# Patient Record
Sex: Female | Born: 1965 | Race: Black or African American | Hispanic: No | Marital: Married | Smoking: Never smoker
Health system: Southern US, Community
[De-identification: ages and names within clinical notes are randomized; demographics above are authoritative.]

## PROBLEM LIST (undated history)

## (undated) DIAGNOSIS — D649 Anemia, unspecified: Secondary | ICD-10-CM

## (undated) DIAGNOSIS — N189 Chronic kidney disease, unspecified: Secondary | ICD-10-CM

## (undated) DIAGNOSIS — E785 Hyperlipidemia, unspecified: Secondary | ICD-10-CM

## (undated) DIAGNOSIS — A86 Unspecified viral encephalitis: Secondary | ICD-10-CM

## (undated) DIAGNOSIS — L309 Dermatitis, unspecified: Secondary | ICD-10-CM

## (undated) DIAGNOSIS — I10 Essential (primary) hypertension: Secondary | ICD-10-CM

## (undated) DIAGNOSIS — K219 Gastro-esophageal reflux disease without esophagitis: Secondary | ICD-10-CM

## (undated) DIAGNOSIS — F419 Anxiety disorder, unspecified: Secondary | ICD-10-CM

## (undated) DIAGNOSIS — R011 Cardiac murmur, unspecified: Secondary | ICD-10-CM

## (undated) DIAGNOSIS — Z01419 Encounter for gynecological examination (general) (routine) without abnormal findings: Secondary | ICD-10-CM

## (undated) DIAGNOSIS — R809 Proteinuria, unspecified: Secondary | ICD-10-CM

## (undated) HISTORY — DX: Encounter for gynecological examination (general) (routine) without abnormal findings: Z01.419

## (undated) HISTORY — DX: Cardiac murmur, unspecified: R01.1

## (undated) HISTORY — DX: Gastro-esophageal reflux disease without esophagitis: K21.9

## (undated) HISTORY — DX: Dermatitis, unspecified: L30.9

## (undated) HISTORY — DX: Anxiety disorder, unspecified: F41.9

## (undated) HISTORY — DX: Anemia, unspecified: D64.9

## (undated) HISTORY — DX: Essential (primary) hypertension: I10

## (undated) HISTORY — DX: Hyperlipidemia, unspecified: E78.5

## (undated) HISTORY — DX: Unspecified viral encephalitis: A86

## (undated) HISTORY — DX: Chronic kidney disease, unspecified: N18.9

## (undated) HISTORY — DX: Proteinuria, unspecified: R80.9

---

## 1986-01-22 DIAGNOSIS — A86 Unspecified viral encephalitis: Secondary | ICD-10-CM

## 1986-01-22 HISTORY — DX: Unspecified viral encephalitis: A86

## 1995-01-23 HISTORY — PX: TUBAL LIGATION: SHX77

## 1997-11-17 ENCOUNTER — Other Ambulatory Visit: Admission: RE | Admit: 1997-11-17 | Discharge: 1997-11-17 | Payer: Self-pay | Admitting: Otolaryngology

## 1998-03-10 ENCOUNTER — Other Ambulatory Visit: Admission: RE | Admit: 1998-03-10 | Discharge: 1998-03-10 | Payer: Self-pay | Admitting: Obstetrics and Gynecology

## 1999-04-20 ENCOUNTER — Other Ambulatory Visit: Admission: RE | Admit: 1999-04-20 | Discharge: 1999-04-20 | Payer: Self-pay | Admitting: Obstetrics and Gynecology

## 2000-04-29 ENCOUNTER — Other Ambulatory Visit: Admission: RE | Admit: 2000-04-29 | Discharge: 2000-04-29 | Payer: Self-pay | Admitting: Obstetrics and Gynecology

## 2002-06-25 ENCOUNTER — Other Ambulatory Visit: Admission: RE | Admit: 2002-06-25 | Discharge: 2002-06-25 | Payer: Self-pay | Admitting: Obstetrics and Gynecology

## 2003-08-04 ENCOUNTER — Other Ambulatory Visit: Admission: RE | Admit: 2003-08-04 | Discharge: 2003-08-04 | Payer: Self-pay | Admitting: Obstetrics and Gynecology

## 2004-02-16 ENCOUNTER — Ambulatory Visit: Payer: Self-pay | Admitting: Family Medicine

## 2004-02-23 ENCOUNTER — Ambulatory Visit: Payer: Self-pay | Admitting: Family Medicine

## 2004-08-15 ENCOUNTER — Other Ambulatory Visit: Admission: RE | Admit: 2004-08-15 | Discharge: 2004-08-15 | Payer: Self-pay | Admitting: Obstetrics and Gynecology

## 2005-03-02 ENCOUNTER — Ambulatory Visit: Payer: Self-pay | Admitting: Family Medicine

## 2005-03-09 ENCOUNTER — Ambulatory Visit: Payer: Self-pay | Admitting: Family Medicine

## 2005-04-12 ENCOUNTER — Ambulatory Visit: Payer: Self-pay | Admitting: Family Medicine

## 2006-03-25 ENCOUNTER — Ambulatory Visit: Payer: Self-pay | Admitting: Family Medicine

## 2006-03-25 LAB — CONVERTED CEMR LAB
BUN: 9 mg/dL (ref 6–23)
Basophils Relative: 0.5 % (ref 0.0–1.0)
Bilirubin, Direct: 0.1 mg/dL (ref 0.0–0.3)
CO2: 28 meq/L (ref 19–32)
Cholesterol: 190 mg/dL (ref 0–200)
Creatinine, Ser: 0.6 mg/dL (ref 0.4–1.2)
GFR calc Af Amer: 142 mL/min
Glucose, Bld: 82 mg/dL (ref 70–99)
HCT: 35.2 % — ABNORMAL LOW (ref 36.0–46.0)
HDL: 51.1 mg/dL (ref >39.0)
Hemoglobin: 11.9 g/dL — ABNORMAL LOW (ref 12.0–15.0)
LDL Cholesterol: 115 mg/dL — ABNORMAL HIGH (ref 0–99)
Lymphocytes Relative: 39.8 % (ref 12.0–46.0)
Monocytes Absolute: 0.4 10*3/uL (ref 0.2–0.7)
Monocytes Relative: 6.6 % (ref 3.0–11.0)
Neutro Abs: 3 10*3/uL (ref 1.4–7.7)
Neutrophils Relative %: 50.3 % (ref 43.0–77.0)
Potassium: 3.4 meq/L — ABNORMAL LOW (ref 3.5–5.1)
RDW: 13.5 % (ref 11.5–14.6)
Sodium: 141 meq/L (ref 135–145)
TSH: 0.54 microintl units/mL (ref 0.35–5.50)
Total Bilirubin: 0.6 mg/dL (ref 0.3–1.2)
Total Protein: 6.8 g/dL (ref 6.0–8.3)
VLDL: 24 mg/dL (ref 0–40)

## 2006-04-01 ENCOUNTER — Ambulatory Visit: Payer: Self-pay | Admitting: Family Medicine

## 2006-09-27 ENCOUNTER — Emergency Department (HOSPITAL_COMMUNITY): Admission: EM | Admit: 2006-09-27 | Discharge: 2006-09-27 | Payer: Self-pay | Admitting: Emergency Medicine

## 2006-10-04 DIAGNOSIS — I10 Essential (primary) hypertension: Secondary | ICD-10-CM

## 2007-04-18 ENCOUNTER — Ambulatory Visit: Payer: Self-pay | Admitting: Family Medicine

## 2007-04-18 LAB — CONVERTED CEMR LAB
Bilirubin Urine: NEGATIVE
Glucose, Urine, Semiquant: NEGATIVE
Protein, U semiquant: NEGATIVE
Specific Gravity, Urine: 1.015
pH: 7

## 2007-04-22 LAB — CONVERTED CEMR LAB
ALT: 14 units/L (ref 0–35)
AST: 17 units/L (ref 0–37)
Albumin: 4 g/dL (ref 3.5–5.2)
BUN: 9 mg/dL (ref 6–23)
Basophils Relative: 0.7 % (ref 0.0–1.0)
CO2: 29 meq/L (ref 19–32)
Calcium: 9.3 mg/dL (ref 8.4–10.5)
Chloride: 107 meq/L (ref 96–112)
Cholesterol: 185 mg/dL (ref 0–200)
Creatinine, Ser: 0.7 mg/dL (ref 0.4–1.2)
Eosinophils Absolute: 0.2 10*3/uL (ref 0.0–0.7)
Eosinophils Relative: 2.3 % (ref 0.0–5.0)
Hemoglobin: 11.7 g/dL — ABNORMAL LOW (ref 12.0–15.0)
MCV: 81 fL (ref 78.0–100.0)
Neutro Abs: 4.3 10*3/uL (ref 1.4–7.7)
Neutrophils Relative %: 54.1 % (ref 43.0–77.0)
RBC: 4.44 M/uL (ref 3.87–5.11)
TSH: 0.56 microintl units/mL (ref 0.35–5.50)
VLDL: 16 mg/dL (ref 0–40)
WBC: 8 10*3/uL (ref 4.5–10.5)

## 2007-04-30 ENCOUNTER — Ambulatory Visit: Payer: Self-pay | Admitting: Family Medicine

## 2007-04-30 DIAGNOSIS — E785 Hyperlipidemia, unspecified: Secondary | ICD-10-CM

## 2007-04-30 DIAGNOSIS — Z8679 Personal history of other diseases of the circulatory system: Secondary | ICD-10-CM | POA: Insufficient documentation

## 2007-04-30 DIAGNOSIS — D509 Iron deficiency anemia, unspecified: Secondary | ICD-10-CM

## 2007-11-10 ENCOUNTER — Ambulatory Visit: Payer: Self-pay | Admitting: Family Medicine

## 2007-11-10 DIAGNOSIS — J069 Acute upper respiratory infection, unspecified: Secondary | ICD-10-CM | POA: Insufficient documentation

## 2007-11-10 DIAGNOSIS — H6120 Impacted cerumen, unspecified ear: Secondary | ICD-10-CM

## 2008-06-23 ENCOUNTER — Ambulatory Visit: Payer: Self-pay | Admitting: Family Medicine

## 2008-06-25 LAB — CONVERTED CEMR LAB
AST: 21 units/L (ref 0–37)
Albumin: 4.1 g/dL (ref 3.5–5.2)
Basophils Absolute: 0 10*3/uL (ref 0.0–0.1)
CO2: 31 meq/L (ref 19–32)
Chloride: 106 meq/L (ref 96–112)
Cholesterol: 195 mg/dL (ref 0–200)
Eosinophils Absolute: 0 10*3/uL (ref 0.0–0.7)
Glucose, Bld: 77 mg/dL (ref 70–99)
HCT: 35.5 % — ABNORMAL LOW (ref 36.0–46.0)
Hemoglobin: 11.8 g/dL — ABNORMAL LOW (ref 12.0–15.0)
Lymphs Abs: 1.8 10*3/uL (ref 0.7–4.0)
MCHC: 33.3 g/dL (ref 30.0–36.0)
MCV: 80.7 fL (ref 78.0–100.0)
Monocytes Absolute: 0.3 10*3/uL (ref 0.1–1.0)
Monocytes Relative: 4.2 % (ref 3.0–12.0)
Neutro Abs: 4.2 10*3/uL (ref 1.4–7.7)
Potassium: 3.1 meq/L — ABNORMAL LOW (ref 3.5–5.1)
RDW: 13.3 % (ref 11.5–14.6)
Sodium: 140 meq/L (ref 135–145)
TSH: 0.59 microintl units/mL (ref 0.35–5.50)

## 2008-06-30 ENCOUNTER — Ambulatory Visit: Payer: Self-pay | Admitting: Family Medicine

## 2009-01-22 HISTORY — PX: TONSILLECTOMY: SUR1361

## 2009-06-27 ENCOUNTER — Ambulatory Visit: Payer: Self-pay | Admitting: Family Medicine

## 2009-07-07 LAB — CONVERTED CEMR LAB
Ketones, urine, test strip: NEGATIVE
Nitrite: NEGATIVE
Specific Gravity, Urine: 1.015

## 2009-07-08 LAB — CONVERTED CEMR LAB
Albumin: 4 g/dL (ref 3.5–5.2)
Basophils Relative: 0.7 % (ref 0.0–3.0)
CO2: 30 meq/L (ref 19–32)
Chloride: 106 meq/L (ref 96–112)
Creatinine, Ser: 0.6 mg/dL (ref 0.4–1.2)
Eosinophils Absolute: 0.1 10*3/uL (ref 0.0–0.7)
HCT: 33.6 % — ABNORMAL LOW (ref 36.0–46.0)
Hemoglobin: 11.4 g/dL — ABNORMAL LOW (ref 12.0–15.0)
MCHC: 33.8 g/dL (ref 30.0–36.0)
MCV: 82.4 fL (ref 78.0–100.0)
Monocytes Absolute: 0.3 10*3/uL (ref 0.1–1.0)
Neutro Abs: 3.8 10*3/uL (ref 1.4–7.7)
RBC: 4.08 M/uL (ref 3.87–5.11)
Sodium: 144 meq/L (ref 135–145)
Total CHOL/HDL Ratio: 3
Total Protein: 6.9 g/dL (ref 6.0–8.3)
Triglycerides: 46 mg/dL (ref 0.0–149.0)

## 2009-07-11 ENCOUNTER — Ambulatory Visit: Payer: Self-pay | Admitting: Family Medicine

## 2010-02-23 NOTE — Assessment & Plan Note (Signed)
Summary: CPX // RS---PT [facility] DUE TO BMP // RS   Vital Signs:  Patient profile:   45 year old female Height:      62 inches Weight:      187 pounds BMI:     34.33 BP sitting:   106 / 82  (left arm) Cuff size:   regular  Vitals Entered By: Raechel Ache, RN (July 11, 2009 8:38 AM) CC: CPX, labs done. Sees gyn.   History of Present Illness: 45 yr old female for a cpx. She feels well and has no concerns.   Allergies: 1)  ! Prinivil (Lisinopril)  Past History:  Past Medical History: Reviewed history from 04/30/2007 and no changes required. Hypertension Heart murmur Coma for 3 days for viral encephalitis Anemia-iron deficiency Hyperlipidemia sees Dr. Arelia Sneddon for gyn exams eczema  Past Surgical History: Reviewed history from 04/30/2007 and no changes required. Tubal ligation Tonsillectomy  Family History: Reviewed history from 04/30/2007 and no changes required. Unremarkable  Social History: Reviewed history from 04/30/2007 and no changes required. Married Never Smoked Alcohol use-yes Drug use-no  Review of Systems  The patient denies anorexia, fever, weight loss, weight gain, vision loss, decreased hearing, hoarseness, chest pain, syncope, dyspnea on exertion, peripheral edema, prolonged cough, headaches, hemoptysis, abdominal pain, melena, hematochezia, severe indigestion/heartburn, hematuria, incontinence, genital sores, muscle weakness, suspicious skin lesions, transient blindness, difficulty walking, depression, unusual weight change, abnormal bleeding, enlarged lymph nodes, angioedema, breast masses, and testicular masses.    Physical Exam  General:  overweight-appearing.   Head:  Normocephalic and atraumatic without obvious abnormalities. No apparent alopecia or balding. Eyes:  No corneal or conjunctival inflammation noted. EOMI. Perrla. Funduscopic exam benign, without hemorrhages, exudates or papilledema. Vision grossly normal. Ears:  External ear exam  shows no significant lesions or deformities.  Otoscopic examination reveals clear canals, tympanic membranes are intact bilaterally without bulging, retraction, inflammation or discharge. Hearing is grossly normal bilaterally. Nose:  External nasal examination shows no deformity or inflammation. Nasal mucosa are pink and moist without lesions or exudates. Mouth:  Oral mucosa and oropharynx without lesions or exudates.  Teeth in good repair. Neck:  No deformities, masses, or tenderness noted. Chest Wall:  No deformities, masses, or tenderness noted. Lungs:  Normal respiratory effort, chest expands symmetrically. Lungs are clear to auscultation, no crackles or wheezes. Heart:  Normal rate and regular rhythm. S1 and S2 normal without gallop, murmur, click, rub or other extra sounds. Abdomen:  Bowel sounds positive,abdomen soft and non-tender without masses, organomegaly or hernias noted. Msk:  No deformity or scoliosis noted of thoracic or lumbar spine.   Pulses:  R and L carotid,radial,femoral,dorsalis pedis and posterior tibial pulses are full and equal bilaterally Extremities:  No clubbing, cyanosis, edema, or deformity noted with normal full range of motion of all joints.   Neurologic:  No cranial nerve deficits noted. Station and gait are normal. Plantar reflexes are down-going bilaterally. DTRs are symmetrical throughout. Sensory, motor and coordinative functions appear intact. Skin:  Intact without suspicious lesions or rashes Cervical Nodes:  No lymphadenopathy noted Axillary Nodes:  No palpable lymphadenopathy Inguinal Nodes:  No significant adenopathy Psych:  Cognition and judgment appear intact. Alert and cooperative with normal attention span and concentration. No apparent delusions, illusions, hallucinations   Impression & Recommendations:  Problem # 1:  PHYSICAL EXAMINATION (ICD-V70.0)  Complete Medication List: 1)  Hydrochlorothiazide 25 Mg Tabs (Hydrochlorothiazide) .... Once  daily 2)  Triamcinolone Acetonide 0.5 % Crea (Triamcinolone acetonide) .... Apply two times  a day as needed 3)  Iron 325 (65 Fe) Mg Tabs (Ferrous sulfate) .Marland Kitchen.. 1 by mouth once daily 4)  Klor-con M20 20 Meq Cr-tabs (Potassium chloride crys cr) .... Once daily 5)  Norvasc 10 Mg Tabs (Amlodipine besylate) .... Once daily  Patient Instructions: 1)  Please schedule a follow-up appointment in 6 months .  2)  It is important that you exercise reguarly at least 20 minutes 5 times a week. If you develop chest pain, have severe difficulty breathing, or feel very tired, stop exercising immediately and seek medical attention.  3)  You need to lose weight. Consider a lower calorie diet and regular exercise.  Prescriptions: NORVASC 10 MG TABS (AMLODIPINE BESYLATE) once daily  #90 x 3   Entered and Authorized by:   Nelwyn Salisbury MD   Signed by:   Nelwyn Salisbury MD on 07/11/2009   Method used:   Electronically to        [facility]  [address] [phone]* (retail)       [address]       [place], [place]  [address]       Ph: [phone]       Fax: [phone]   RxID:   [id number] KLOR-CON M20 20 MEQ CR-TABS (POTASSIUM CHLORIDE CRYS CR) once daily  #90 x 3   Entered and Authorized by:   Nelwyn Salisbury MD   Signed by:   Nelwyn Salisbury MD on 07/11/2009   Method used:   Electronically to        [facility]  [address] [phone]* (retail)       [address]       [place], [place]  [address]       Ph: [phone]       Fax: [phone]   RxID:   [id number] HYDROCHLOROTHIAZIDE 25 MG  TABS (HYDROCHLOROTHIAZIDE) once daily  #90 x 3   Entered and Authorized by:   Nelwyn Salisbury MD   Signed by:   Nelwyn Salisbury MD on 07/11/2009   Method used:   [facility]ically to        [facility]  [address] [phone]* (retail)       [address]       [place], [place]  [address]       Ph: [phone]       Fax: [phone]   RxID:   [id number]

## 2010-06-09 NOTE — Assessment & Plan Note (Signed)
[facility] OFFICE NOTE   Kristina Gillespie, Kristina Gillespie                     MRN:          [id number]  DATE:04/01/2006                            DOB:          [birth year]    This is a 45 year old woman here for a non-gynecological physical  examination.  She feels fine and has no complaints at all.  She  continues to see Dr. Arelia Sneddon on a regular basis for GYN exams.  We had  treated her in the past for acid reflux symptoms but these are well-  controlled.  She tries to watch her diet but admits to getting very  little exercise.  For further details of her past medical history,  family history, social history, labs, etc., refer to her last physical  note dated March 09, 2005.   ALLERGIES:  PRINIVIL.   CURRENT MEDICATIONS:  1. Hydrochlorothiazide 12.5 mg per day.  2. Atenolol 25 mg per day.  3. K-Dur 10 mEq per day.   OBJECTIVE:  VITAL SIGNS:  Height 5 feet 3 inches, weight 200, BP 132/82,  pulse 68 and regular.  GENERAL:  She remains overweight.  SKIN:  Clear.  HEENT:  Eyes clear.  Sclerae and pharynx are clear.  NECK:  Supple without lymphadenopathy or masses.  LUNGS:  Clear.  CARDIAC:  Rate and rhythm regular without gallops, murmurs or rubs.  The  distal pulses are full.  ABDOMEN:  Soft, normal bowel sounds, nontender, no masses.  EXTREMITIES:  No clubbing, cyanosis, or edema.  NEUROLOGIC:  Grossly intact.   She was here for fasting labs on March 3.  This is remarkable for an  excellent HDL at 51 with a slightly elevated LDL at 115.  Hemoglobin was  acceptable at 11.9.   ASSESSMENT AND PLAN:  1. Complete physical.  We talked about increasing exercise and losing      weight.  2. Hypertension, stable.  3. Anemia, stable off of medications.  4. Gastroesophageal reflux disease, stable off of medications.     Tera Mater. Clent Ridges, MD  Electronically Signed   SAF/MedQ  DD: 04/01/2006  DT: 04/02/2006  Job #:  [id number]

## 2010-08-09 ENCOUNTER — Other Ambulatory Visit: Payer: Self-pay | Admitting: Family Medicine

## 2010-08-21 ENCOUNTER — Other Ambulatory Visit: Payer: Self-pay | Admitting: *Deleted

## 2010-08-21 ENCOUNTER — Other Ambulatory Visit (INDEPENDENT_AMBULATORY_CARE_PROVIDER_SITE_OTHER): Payer: Self-pay | Admitting: Family Medicine

## 2010-08-21 ENCOUNTER — Other Ambulatory Visit: Payer: Self-pay | Admitting: Family Medicine

## 2010-08-21 ENCOUNTER — Other Ambulatory Visit (INDEPENDENT_AMBULATORY_CARE_PROVIDER_SITE_OTHER): Payer: Self-pay

## 2010-08-21 DIAGNOSIS — Z0000 Encounter for general adult medical examination without abnormal findings: Secondary | ICD-10-CM

## 2010-08-21 LAB — CBC WITH DIFFERENTIAL/PLATELET
Basophils Absolute: 0 10*3/uL (ref 0.0–0.1)
Basophils Relative: 0.6 % (ref 0.0–3.0)
Eosinophils Relative: 3 % (ref 0.0–5.0)
HCT: 34.3 % — ABNORMAL LOW (ref 36.0–46.0)
Hemoglobin: 11.1 g/dL — ABNORMAL LOW (ref 12.0–15.0)
Lymphocytes Relative: 42.6 % (ref 12.0–46.0)
Lymphs Abs: 3.2 10*3/uL (ref 0.7–4.0)
Monocytes Relative: 4.6 % (ref 3.0–12.0)
Neutro Abs: 3.7 10*3/uL (ref 1.4–7.7)
RBC: 4.24 Mil/uL (ref 3.87–5.11)
WBC: 7.4 10*3/uL (ref 4.5–10.5)

## 2010-08-21 LAB — HEPATIC FUNCTION PANEL
ALT: 18 U/L (ref 0–35)
AST: 19 U/L (ref 0–37)
Albumin: 4.1 g/dL (ref 3.5–5.2)
Alkaline Phosphatase: 50 U/L (ref 39–117)
Bilirubin, Direct: 0 mg/dL (ref 0.0–0.3)
Total Bilirubin: 0.5 mg/dL (ref 0.3–1.2)
Total Protein: 7.2 g/dL (ref 6.0–8.3)

## 2010-08-21 LAB — BASIC METABOLIC PANEL
Calcium: 8.9 mg/dL (ref 8.4–10.5)
GFR: 167.05 mL/min (ref >60.00)
Potassium: 3.2 mEq/L — ABNORMAL LOW (ref 3.5–5.1)
Sodium: 143 mEq/L (ref 135–145)

## 2010-08-21 LAB — LIPID PANEL
HDL: 61.9 mg/dL (ref >39.00)
LDL Cholesterol: 99 mg/dL (ref 0–99)
Total CHOL/HDL Ratio: 3
VLDL: 16.2 mg/dL (ref 0.0–40.0)

## 2010-08-21 LAB — TSH: TSH: 0.51 u[IU]/mL (ref 0.35–5.50)

## 2010-08-21 LAB — URINALYSIS
Bilirubin Urine: NEGATIVE
Hgb urine dipstick: NEGATIVE
Urine Glucose: NEGATIVE
Urobilinogen, UA: 0.2 (ref 0.0–1.0)

## 2010-08-25 ENCOUNTER — Encounter: Payer: Self-pay | Admitting: Family Medicine

## 2010-08-28 ENCOUNTER — Ambulatory Visit (INDEPENDENT_AMBULATORY_CARE_PROVIDER_SITE_OTHER): Payer: Commercial Managed Care - PPO | Admitting: Family Medicine

## 2010-08-28 ENCOUNTER — Encounter: Payer: Self-pay | Admitting: Family Medicine

## 2010-08-28 VITALS — BP 116/70 | HR 68 | Temp 98.5°F | Ht 62.0 in | Wt 186.0 lb

## 2010-08-28 DIAGNOSIS — Z0000 Encounter for general adult medical examination without abnormal findings: Secondary | ICD-10-CM

## 2010-08-28 MED ORDER — AMLODIPINE BESYLATE 10 MG PO TABS: 10.0000 mg | ORAL_TABLET | Freq: Every day | ORAL | Status: DC

## 2010-08-28 MED ORDER — HYDROCHLOROTHIAZIDE 25 MG PO TABS: 25.0000 mg | ORAL_TABLET | Freq: Every day | ORAL | Status: DC

## 2010-08-28 MED ORDER — POTASSIUM CHLORIDE CRYS ER 20 MEQ PO TBCR: 20.0000 meq | EXTENDED_RELEASE_TABLET | Freq: Two times a day (BID) | ORAL | Status: DC

## 2010-08-28 NOTE — Progress Notes (Signed)
  Subjective:    Patient ID: Kristina Gillespie, female    DOB: 21-Nov-1965, 45 y.o.   MRN: [id number]  HPI 45 yr old female for a cpx. She feels fine and has no complaints.    Review of Systems  Constitutional: Negative.   HENT: Negative.   Eyes: Negative.   Respiratory: Negative.   Cardiovascular: Negative.   Gastrointestinal: Negative.   Genitourinary: Negative for dysuria, urgency, frequency, hematuria, flank pain, decreased urine volume, enuresis, difficulty urinating, pelvic pain and dyspareunia.  Musculoskeletal: Negative.   Skin: Negative.   Neurological: Negative.   Hematological: Negative.   Psychiatric/Behavioral: Negative.        Objective:   Physical Exam  Constitutional: She is oriented to person, place, and time. She appears well-developed and well-nourished. No distress.  HENT:  Head: Normocephalic and atraumatic.  Right Ear: External ear normal.  Left Ear: External ear normal.  Nose: Nose normal.  Mouth/Throat: Oropharynx is clear and moist. No oropharyngeal exudate.  Eyes: Conjunctivae and EOM are normal. Pupils are equal, round, and reactive to light. No scleral icterus.  Neck: Normal range of motion. Neck supple. No JVD present. No thyromegaly present.  Cardiovascular: Normal rate, regular rhythm, normal heart sounds and intact distal pulses.  Exam reveals no gallop and no friction rub.   No murmur heard. Pulmonary/Chest: Effort normal and breath sounds normal. No respiratory distress. She has no wheezes. She has no rales. She exhibits no tenderness.  Abdominal: Soft. Bowel sounds are normal. She exhibits no distension and no mass. There is no tenderness. There is no rebound and no guarding.  Musculoskeletal: Normal range of motion. She exhibits no edema and no tenderness.  Lymphadenopathy:    She has no cervical adenopathy.  Neurological: She is alert and oriented to person, place, and time. She has normal reflexes. No cranial nerve deficit. She exhibits  normal muscle tone. Coordination normal.  Skin: Skin is warm and dry. No rash noted. No erythema.  Psychiatric: She has a normal mood and affect. Her behavior is normal. Judgment and thought content normal.          Assessment & Plan:  Well exam. Get more exercise

## 2010-10-16 ENCOUNTER — Ambulatory Visit: Payer: Self-pay | Admitting: Internal Medicine

## 2010-11-03 LAB — POCT URINALYSIS DIP (DEVICE)
Operator id: 239701
Protein, ur: NEGATIVE
Specific Gravity, Urine: 1.01
Urobilinogen, UA: 0.2
pH: 6

## 2011-08-22 ENCOUNTER — Other Ambulatory Visit (INDEPENDENT_AMBULATORY_CARE_PROVIDER_SITE_OTHER): Payer: Commercial Managed Care - PPO

## 2011-08-22 DIAGNOSIS — Z0000 Encounter for general adult medical examination without abnormal findings: Secondary | ICD-10-CM

## 2011-08-22 LAB — BASIC METABOLIC PANEL
BUN: 12 mg/dL (ref 6–23)
Calcium: 9.1 mg/dL (ref 8.4–10.5)
Creatinine, Ser: 0.7 mg/dL (ref 0.4–1.2)
GFR: 113.54 mL/min (ref >60.00)
Glucose, Bld: 76 mg/dL (ref 70–99)
Sodium: 138 mEq/L (ref 135–145)

## 2011-08-22 LAB — CBC WITH DIFFERENTIAL/PLATELET
Basophils Absolute: 0 10*3/uL (ref 0.0–0.1)
Eosinophils Absolute: 0.2 10*3/uL (ref 0.0–0.7)
HCT: 39.1 % (ref 36.0–46.0)
Hemoglobin: 12.9 g/dL (ref 12.0–15.0)
Lymphs Abs: 2.5 10*3/uL (ref 0.7–4.0)
MCHC: 33 g/dL (ref 30.0–36.0)
Monocytes Relative: 4.8 % (ref 3.0–12.0)
Neutro Abs: 4 10*3/uL (ref 1.4–7.7)
Platelets: 182 10*3/uL (ref 150.0–400.0)
RDW: 15.9 % — ABNORMAL HIGH (ref 11.5–14.6)

## 2011-08-22 LAB — LIPID PANEL
Cholesterol: 179 mg/dL (ref 0–200)
HDL: 60.7 mg/dL (ref >39.00)
Triglycerides: 109 mg/dL (ref 0.0–149.0)
VLDL: 21.8 mg/dL (ref 0.0–40.0)

## 2011-08-22 LAB — POCT URINALYSIS DIPSTICK
Bilirubin, UA: NEGATIVE
Blood, UA: NEGATIVE
Ketones, UA: NEGATIVE
Protein, UA: NEGATIVE
Spec Grav, UA: 1.015
pH, UA: 7

## 2011-08-22 LAB — TSH: TSH: 0.57 u[IU]/mL (ref 0.35–5.50)

## 2011-08-22 LAB — HEPATIC FUNCTION PANEL
AST: 17 U/L (ref 0–37)
Albumin: 4.1 g/dL (ref 3.5–5.2)
Total Bilirubin: 0.4 mg/dL (ref 0.3–1.2)

## 2011-08-27 NOTE — Progress Notes (Signed)
Quick Note:  I spoke with pt and she will wait until this week to get the new increase on potassium. ______

## 2011-08-29 ENCOUNTER — Ambulatory Visit (INDEPENDENT_AMBULATORY_CARE_PROVIDER_SITE_OTHER): Payer: Commercial Managed Care - PPO | Admitting: Family Medicine

## 2011-08-29 ENCOUNTER — Encounter: Payer: Self-pay | Admitting: Family Medicine

## 2011-08-29 VITALS — BP 120/68 | HR 96 | Temp 98.4°F | Ht 62.75 in | Wt 184.0 lb

## 2011-08-29 DIAGNOSIS — Z0000 Encounter for general adult medical examination without abnormal findings: Secondary | ICD-10-CM

## 2011-08-29 MED ORDER — AMLODIPINE BESYLATE 10 MG PO TABS: 10.0000 mg | ORAL_TABLET | Freq: Every day | ORAL | Status: DC

## 2011-08-29 MED ORDER — TRIAMCINOLONE ACETONIDE 0.5 % EX CREA: TOPICAL_CREAM | Freq: Two times a day (BID) | CUTANEOUS | Status: DC

## 2011-08-29 MED ORDER — FERROUS GLUCONATE 325 MG PO TABS: 325.0000 mg | ORAL_TABLET | Freq: Every day | ORAL | Status: DC

## 2011-08-29 MED ORDER — HYDROCHLOROTHIAZIDE 25 MG PO TABS: 25.0000 mg | ORAL_TABLET | Freq: Every day | ORAL | Status: DC

## 2011-08-29 MED ORDER — POTASSIUM CHLORIDE CRYS ER 20 MEQ PO TBCR: 20.0000 meq | EXTENDED_RELEASE_TABLET | Freq: Two times a day (BID) | ORAL | Status: DC

## 2011-08-29 NOTE — Progress Notes (Signed)
  Subjective:    Patient ID: Kristina Gillespie, female    DOB: 29-Feb-1965, 46 y.o.   MRN: [id number]  HPI 46 yr old female for a cpx. She feels fine and has no concerns. Her recent potassium level was low, and we thought she was taking her potassium pills twice a day. In fact she has only been taking these once a day.    Review of Systems  Constitutional: Negative.   HENT: Negative.   Eyes: Negative.   Respiratory: Negative.   Cardiovascular: Negative.   Gastrointestinal: Negative.   Genitourinary: Negative for dysuria, urgency, frequency, hematuria, flank pain, decreased urine volume, enuresis, difficulty urinating, pelvic pain and dyspareunia.  Musculoskeletal: Negative.   Skin: Negative.   Neurological: Negative.   Hematological: Negative.   Psychiatric/Behavioral: Negative.        Objective:   Physical Exam  Constitutional: She is oriented to person, place, and time. She appears well-developed and well-nourished. No distress.  HENT:  Head: Normocephalic and atraumatic.  Right Ear: External ear normal.  Left Ear: External ear normal.  Nose: Nose normal.  Mouth/Throat: Oropharynx is clear and moist. No oropharyngeal exudate.  Eyes: Conjunctivae and EOM are normal. Pupils are equal, round, and reactive to light. No scleral icterus.  Neck: Normal range of motion. Neck supple. No JVD present. No thyromegaly present.  Cardiovascular: Normal rate, regular rhythm, normal heart sounds and intact distal pulses.  Exam reveals no gallop and no friction rub.   No murmur heard. Pulmonary/Chest: Effort normal and breath sounds normal. No respiratory distress. She has no wheezes. She has no rales. She exhibits no tenderness.  Abdominal: Soft. Bowel sounds are normal. She exhibits no distension and no mass. There is no tenderness. There is no rebound and no guarding.  Musculoskeletal: Normal range of motion. She exhibits no edema and no tenderness.  Lymphadenopathy:    She has no cervical  adenopathy.  Neurological: She is alert and oriented to person, place, and time. She has normal reflexes. No cranial nerve deficit. She exhibits normal muscle tone. Coordination normal.  Skin: Skin is warm and dry. No rash noted. No erythema.  Psychiatric: She has a normal mood and affect. Her behavior is normal. Judgment and thought content normal.          Assessment & Plan:  Well exam. Encouraged her to take the potassium bid as prescribed.

## 2012-08-29 ENCOUNTER — Other Ambulatory Visit (INDEPENDENT_AMBULATORY_CARE_PROVIDER_SITE_OTHER): Payer: 59

## 2012-08-29 ENCOUNTER — Other Ambulatory Visit: Payer: Self-pay | Admitting: Family Medicine

## 2012-08-29 ENCOUNTER — Encounter: Payer: Self-pay | Admitting: Family Medicine

## 2012-08-29 DIAGNOSIS — Z0000 Encounter for general adult medical examination without abnormal findings: Secondary | ICD-10-CM

## 2012-08-29 DIAGNOSIS — E876 Hypokalemia: Secondary | ICD-10-CM

## 2012-08-29 LAB — CBC WITH DIFFERENTIAL/PLATELET
Basophils Absolute: 0 10*3/uL (ref 0.0–0.1)
HCT: 39.7 % (ref 36.0–46.0)
Hemoglobin: 13.2 g/dL (ref 12.0–15.0)
Lymphs Abs: 2.9 10*3/uL (ref 0.7–4.0)
MCV: 89.1 fl (ref 78.0–100.0)
Monocytes Absolute: 0.3 10*3/uL (ref 0.1–1.0)
Monocytes Relative: 3.9 % (ref 3.0–12.0)
Neutro Abs: 5.3 10*3/uL (ref 1.4–7.7)
Platelets: 202 10*3/uL (ref 150.0–400.0)
RDW: 13.1 % (ref 11.5–14.6)

## 2012-08-29 LAB — BASIC METABOLIC PANEL
BUN: 9 mg/dL (ref 6–23)
CO2: 29 mEq/L (ref 19–32)
Creatinine, Ser: 0.7 mg/dL (ref 0.4–1.2)
Glucose, Bld: 81 mg/dL (ref 70–99)
Potassium: 2.8 mEq/L — CL (ref 3.5–5.1)
Sodium: 139 mEq/L (ref 135–145)

## 2012-08-29 LAB — POCT URINALYSIS DIPSTICK
Glucose, UA: NEGATIVE
Nitrite, UA: NEGATIVE
Urobilinogen, UA: 1

## 2012-08-29 LAB — TSH: TSH: 0.64 u[IU]/mL (ref 0.35–5.50)

## 2012-08-29 LAB — LIPID PANEL
LDL Cholesterol: 100 mg/dL — ABNORMAL HIGH (ref 0–99)
Total CHOL/HDL Ratio: 3

## 2012-08-29 LAB — HEPATIC FUNCTION PANEL
ALT: 14 U/L (ref 0–35)
Albumin: 3.9 g/dL (ref 3.5–5.2)
Total Bilirubin: 0.6 mg/dL (ref 0.3–1.2)

## 2012-08-29 NOTE — Progress Notes (Signed)
Quick Note:  I spoke with pt, she does not need refill right now. ______

## 2012-09-05 ENCOUNTER — Ambulatory Visit (INDEPENDENT_AMBULATORY_CARE_PROVIDER_SITE_OTHER): Payer: Commercial Managed Care - PPO | Admitting: Family Medicine

## 2012-09-05 DIAGNOSIS — I10 Essential (primary) hypertension: Secondary | ICD-10-CM

## 2012-09-07 NOTE — Progress Notes (Signed)
  Subjective:    Patient ID: Kristina Gillespie, female    DOB: [birth year], 47 y.o.   MRN: [id number]  HPI This appt was cancelled   Review of Systems     Objective:   Physical Exam        Assessment & Plan:

## 2012-09-08 ENCOUNTER — Encounter: Payer: Self-pay | Admitting: Family Medicine

## 2012-09-08 ENCOUNTER — Ambulatory Visit (INDEPENDENT_AMBULATORY_CARE_PROVIDER_SITE_OTHER): Payer: 59 | Admitting: Family Medicine

## 2012-09-08 VITALS — BP 120/88 | Temp 98.3°F | Ht 62.0 in | Wt 185.0 lb

## 2012-09-08 DIAGNOSIS — Z0000 Encounter for general adult medical examination without abnormal findings: Secondary | ICD-10-CM

## 2012-09-08 MED ORDER — TRIAMCINOLONE ACETONIDE 0.5 % EX CREA: TOPICAL_CREAM | Freq: Two times a day (BID) | CUTANEOUS | Status: DC

## 2012-09-08 MED ORDER — POTASSIUM CHLORIDE CRYS ER 20 MEQ PO TBCR: 40.0000 meq | EXTENDED_RELEASE_TABLET | Freq: Two times a day (BID) | ORAL | Status: DC

## 2012-09-08 MED ORDER — FERROUS GLUCONATE 325 MG PO TABS: 325.0000 mg | ORAL_TABLET | Freq: Every day | ORAL | Status: DC

## 2012-09-08 MED ORDER — AMLODIPINE BESYLATE 10 MG PO TABS: 10.0000 mg | ORAL_TABLET | Freq: Every day | ORAL | Status: DC

## 2012-09-08 MED ORDER — HYDROCHLOROTHIAZIDE 25 MG PO TABS: 25.0000 mg | ORAL_TABLET | Freq: Every day | ORAL | Status: DC

## 2012-09-08 NOTE — Progress Notes (Signed)
  Subjective:    Patient ID: Kristina Gillespie, female    DOB: April 17, 1965, 47 y.o.   MRN: [id number]  HPI 47 yr old female for a cpx. She feels well.    Review of Systems  Constitutional: Negative.   HENT: Negative.   Eyes: Negative.   Respiratory: Negative.   Cardiovascular: Negative.   Gastrointestinal: Negative.   Genitourinary: Negative for dysuria, urgency, frequency, hematuria, flank pain, decreased urine volume, enuresis, difficulty urinating, pelvic pain and dyspareunia.  Musculoskeletal: Negative.   Skin: Negative.   Neurological: Negative.   Psychiatric/Behavioral: Negative.        Objective:   Physical Exam  Constitutional: She is oriented to person, place, and time. She appears well-developed and well-nourished. No distress.  HENT:  Head: Normocephalic and atraumatic.  Right Ear: External ear normal.  Left Ear: External ear normal.  Nose: Nose normal.  Mouth/Throat: Oropharynx is clear and moist. No oropharyngeal exudate.  Eyes: Conjunctivae and EOM are normal. Pupils are equal, round, and reactive to light. No scleral icterus.  Neck: Normal range of motion. Neck supple. No JVD present. No thyromegaly present.  Cardiovascular: Normal rate, regular rhythm, normal heart sounds and intact distal pulses.  Exam reveals no gallop and no friction rub.   No murmur heard. Pulmonary/Chest: Effort normal and breath sounds normal. No respiratory distress. She has no wheezes. She has no rales. She exhibits no tenderness.  Abdominal: Soft. Bowel sounds are normal. She exhibits no distension and no mass. There is no tenderness. There is no rebound and no guarding.  Musculoskeletal: Normal range of motion. She exhibits no edema and no tenderness.  Lymphadenopathy:    She has no cervical adenopathy.  Neurological: She is alert and oriented to person, place, and time. She has normal reflexes. No cranial nerve deficit. She exhibits normal muscle tone. Coordination normal.  Skin: Skin  is warm and dry. No rash noted. No erythema.  Psychiatric: She has a normal mood and affect. Her behavior is normal. Judgment and thought content normal.          Assessment & Plan:  Well exam.

## 2012-09-10 ENCOUNTER — Other Ambulatory Visit: Payer: Self-pay | Admitting: Family Medicine

## 2012-09-12 ENCOUNTER — Other Ambulatory Visit (INDEPENDENT_AMBULATORY_CARE_PROVIDER_SITE_OTHER): Payer: 59

## 2012-09-12 DIAGNOSIS — I10 Essential (primary) hypertension: Secondary | ICD-10-CM

## 2012-09-12 LAB — BASIC METABOLIC PANEL
CO2: 27 mEq/L (ref 19–32)
Calcium: 9.2 mg/dL (ref 8.4–10.5)
GFR: 114.89 mL/min (ref >60.00)
Glucose, Bld: 98 mg/dL (ref 70–99)
Potassium: 3.2 mEq/L — ABNORMAL LOW (ref 3.5–5.1)
Sodium: 138 mEq/L (ref 135–145)

## 2012-11-27 ENCOUNTER — Other Ambulatory Visit: Payer: Self-pay

## 2013-08-31 ENCOUNTER — Other Ambulatory Visit (INDEPENDENT_AMBULATORY_CARE_PROVIDER_SITE_OTHER): Payer: 59

## 2013-08-31 DIAGNOSIS — Z0000 Encounter for general adult medical examination without abnormal findings: Secondary | ICD-10-CM

## 2013-08-31 LAB — CBC WITH DIFFERENTIAL/PLATELET
BASOS ABS: 0 10*3/uL (ref 0.0–0.1)
Basophils Relative: 0.3 % (ref 0.0–3.0)
EOS ABS: 0.1 10*3/uL (ref 0.0–0.7)
EOS PCT: 1.4 % (ref 0.0–5.0)
HCT: 38.7 % (ref 36.0–46.0)
Hemoglobin: 12.8 g/dL (ref 12.0–15.0)
Lymphocytes Relative: 38 % (ref 12.0–46.0)
Lymphs Abs: 3 10*3/uL (ref 0.7–4.0)
MCHC: 33.1 g/dL (ref 30.0–36.0)
MCV: 87.9 fl (ref 78.0–100.0)
MONO ABS: 0.3 10*3/uL (ref 0.1–1.0)
Monocytes Relative: 3.8 % (ref 3.0–12.0)
NEUTROS PCT: 56.5 % (ref 43.0–77.0)
Neutro Abs: 4.5 10*3/uL (ref 1.4–7.7)
PLATELETS: 213 10*3/uL (ref 150.0–400.0)
RBC: 4.4 Mil/uL (ref 3.87–5.11)
RDW: 13.5 % (ref 11.5–15.5)
WBC: 8 10*3/uL (ref 4.0–10.5)

## 2013-08-31 LAB — POCT URINALYSIS DIPSTICK
BILIRUBIN UA: NEGATIVE
Glucose, UA: NEGATIVE
KETONES UA: NEGATIVE
Leukocytes, UA: NEGATIVE
Nitrite, UA: NEGATIVE
PH UA: 8.5
RBC UA: NEGATIVE
Spec Grav, UA: 1.015
Urobilinogen, UA: 1

## 2013-08-31 LAB — LIPID PANEL
CHOLESTEROL: 170 mg/dL (ref 0–200)
HDL: 54.7 mg/dL (ref >39.00)
LDL Cholesterol: 103 mg/dL — ABNORMAL HIGH (ref 0–99)
NonHDL: 115.3
TRIGLYCERIDES: 64 mg/dL (ref 0.0–149.0)
Total CHOL/HDL Ratio: 3
VLDL: 12.8 mg/dL (ref 0.0–40.0)

## 2013-08-31 LAB — HEPATIC FUNCTION PANEL
ALK PHOS: 47 U/L (ref 39–117)
ALT: 11 U/L (ref 0–35)
AST: 15 U/L (ref 0–37)
Albumin: 3.9 g/dL (ref 3.5–5.2)
BILIRUBIN DIRECT: 0 mg/dL (ref 0.0–0.3)
BILIRUBIN TOTAL: 0.8 mg/dL (ref 0.2–1.2)
TOTAL PROTEIN: 7.1 g/dL (ref 6.0–8.3)

## 2013-08-31 LAB — BASIC METABOLIC PANEL
BUN: 9 mg/dL (ref 6–23)
CO2: 27 mEq/L (ref 19–32)
Calcium: 8.8 mg/dL (ref 8.4–10.5)
Chloride: 106 mEq/L (ref 96–112)
Creatinine, Ser: 0.6 mg/dL (ref 0.4–1.2)
GFR: 134.12 mL/min (ref >60.00)
Glucose, Bld: 82 mg/dL (ref 70–99)
POTASSIUM: 3 meq/L — AB (ref 3.5–5.1)
SODIUM: 139 meq/L (ref 135–145)

## 2013-08-31 LAB — TSH: TSH: 0.44 u[IU]/mL (ref 0.35–4.50)

## 2013-09-08 ENCOUNTER — Encounter: Payer: Self-pay | Admitting: Family Medicine

## 2013-09-08 ENCOUNTER — Ambulatory Visit (INDEPENDENT_AMBULATORY_CARE_PROVIDER_SITE_OTHER): Payer: 59 | Admitting: Family Medicine

## 2013-09-08 VITALS — BP 113/67 | HR 78 | Temp 98.6°F | Ht 62.0 in | Wt 188.0 lb

## 2013-09-08 DIAGNOSIS — Z0000 Encounter for general adult medical examination without abnormal findings: Secondary | ICD-10-CM

## 2013-09-08 DIAGNOSIS — E876 Hypokalemia: Secondary | ICD-10-CM

## 2013-09-08 MED ORDER — POTASSIUM CHLORIDE ER 10 MEQ PO TBCR: 20.0000 meq | EXTENDED_RELEASE_TABLET | Freq: Two times a day (BID) | ORAL | Status: DC

## 2013-09-08 MED ORDER — AMLODIPINE BESYLATE 5 MG PO TABS: 5.0000 mg | ORAL_TABLET | Freq: Every day | ORAL | Status: DC

## 2013-09-08 MED ORDER — HYDROCHLOROTHIAZIDE 12.5 MG PO CAPS: 12.5000 mg | ORAL_CAPSULE | Freq: Every day | ORAL | Status: DC

## 2013-09-08 MED ORDER — FERROUS GLUCONATE 324 (38 FE) MG PO TABS: ORAL_TABLET | ORAL | Status: DC

## 2013-09-08 NOTE — Progress Notes (Signed)
Pre visit review using our clinic review tool, if applicable. No additional management support is needed unless otherwise documented below in the visit note. 

## 2013-09-08 NOTE — Progress Notes (Signed)
   Subjective:    Patient ID: Kristina Gillespie, female    DOB: [birth year], 48 y.o.   MRN: [id number]  HPI 48 yr old female for a cpx. She feels fine. She is considering joining a running club to get exercise and lose some weight.    Review of Systems  Constitutional: Negative.   HENT: Negative.   Eyes: Negative.   Respiratory: Negative.   Cardiovascular: Negative.   Gastrointestinal: Negative.   Genitourinary: Negative for dysuria, urgency, frequency, hematuria, flank pain, decreased urine volume, enuresis, difficulty urinating, pelvic pain and dyspareunia.  Musculoskeletal: Negative.   Skin: Negative.   Neurological: Negative.   Psychiatric/Behavioral: Negative.        Objective:   Physical Exam  Constitutional: She is oriented to person, place, and time. She appears well-developed and well-nourished. No distress.  HENT:  Head: Normocephalic and atraumatic.  Right Ear: External ear normal.  Left Ear: External ear normal.  Nose: Nose normal.  Mouth/Throat: Oropharynx is clear and moist. No oropharyngeal exudate.  Eyes: Conjunctivae and EOM are normal. Pupils are equal, round, and reactive to light. No scleral icterus.  Neck: Normal range of motion. Neck supple. No JVD present. No thyromegaly present.  Cardiovascular: Normal rate, regular rhythm, normal heart sounds and intact distal pulses.  Exam reveals no gallop and no friction rub.   No murmur heard. Pulmonary/Chest: Effort normal and breath sounds normal. No respiratory distress. She has no wheezes. She has no rales. She exhibits no tenderness.  Abdominal: Soft. Bowel sounds are normal. She exhibits no distension and no mass. There is no tenderness. There is no rebound and no guarding.  Musculoskeletal: Normal range of motion. She exhibits no edema and no tenderness.  Lymphadenopathy:    She has no cervical adenopathy.  Neurological: She is alert and oriented to person, place, and time. She has normal reflexes. No cranial  nerve deficit. She exhibits normal muscle tone. Coordination normal.  Skin: Skin is warm and dry. No rash noted. No erythema.  Psychiatric: She has a normal mood and affect. Her behavior is normal. Judgment and thought content normal.          Assessment & Plan:  Well exam. We will decrease her doses of Amlodipine, HCTZ, and potassium by 50%. I encouraged her to exercise more. Recheck in 90 days

## 2013-10-06 ENCOUNTER — Other Ambulatory Visit: Payer: Self-pay | Admitting: Family Medicine

## 2013-11-06 ENCOUNTER — Other Ambulatory Visit: Payer: Self-pay

## 2013-11-16 ENCOUNTER — Telehealth: Payer: Self-pay | Admitting: Family Medicine

## 2013-11-16 NOTE — Telephone Encounter (Signed)
Just make an OV and we can get labs that day

## 2013-11-16 NOTE — Telephone Encounter (Addendum)
Pt instructed to:    decrease her doses of Amlodipine, HCTZ, and potassium by 50%.  Recheck in 90 days  Pt wants to know should this be labs only, lab prior to appt or just an appt?

## 2013-11-19 NOTE — Telephone Encounter (Signed)
Pt called back and I gave her the information as per Dr Sherren Mocha

## 2013-12-08 ENCOUNTER — Ambulatory Visit (INDEPENDENT_AMBULATORY_CARE_PROVIDER_SITE_OTHER): Payer: 59 | Admitting: Family Medicine

## 2013-12-08 ENCOUNTER — Encounter: Payer: Self-pay | Admitting: Family Medicine

## 2013-12-08 VITALS — BP 121/90 | HR 79 | Temp 98.0°F | Ht 62.0 in | Wt 190.0 lb

## 2013-12-08 DIAGNOSIS — Z23 Encounter for immunization: Secondary | ICD-10-CM

## 2013-12-08 DIAGNOSIS — E876 Hypokalemia: Secondary | ICD-10-CM

## 2013-12-08 DIAGNOSIS — I10 Essential (primary) hypertension: Secondary | ICD-10-CM

## 2013-12-08 NOTE — Progress Notes (Signed)
Pre visit review using our clinic review tool, if applicable. No additional management support is needed unless otherwise documented below in the visit note. 

## 2013-12-08 NOTE — Progress Notes (Signed)
   Subjective:    Patient ID: Kristina Gillespie, female    DOB: 01-11-1966, 48 y.o.   MRN: [id number]  HPI Here to follow up after we decreased her meds by 50% about 3 months ago. She feels fine.    Review of Systems  Constitutional: Negative.   Respiratory: Negative.   Cardiovascular: Negative.        Objective:   Physical Exam  Constitutional: She appears well-developed and well-nourished.  Cardiovascular: Normal rate, regular rhythm, normal heart sounds and intact distal pulses.   Pulmonary/Chest: Effort normal and breath sounds normal.          Assessment & Plan:  Her BP is stable. We will send her for a BMET today to check the potassium level.

## 2013-12-09 ENCOUNTER — Ambulatory Visit: Payer: 59 | Admitting: Family Medicine

## 2013-12-09 LAB — BASIC METABOLIC PANEL
BUN: 8 mg/dL (ref 6–23)
CHLORIDE: 105 meq/L (ref 96–112)
CO2: 27 meq/L (ref 19–32)
CREATININE: 0.7 mg/dL (ref 0.4–1.2)
Calcium: 9 mg/dL (ref 8.4–10.5)
GFR: 120.22 mL/min (ref >60.00)
GLUCOSE: 82 mg/dL (ref 70–99)
POTASSIUM: 3.2 meq/L — AB (ref 3.5–5.1)
Sodium: 140 mEq/L (ref 135–145)

## 2013-12-10 ENCOUNTER — Telehealth: Payer: Self-pay | Admitting: Family Medicine

## 2013-12-10 NOTE — Telephone Encounter (Signed)
emmi emailed °

## 2014-05-25 ENCOUNTER — Encounter: Payer: Self-pay | Admitting: Family Medicine

## 2014-05-25 ENCOUNTER — Ambulatory Visit (INDEPENDENT_AMBULATORY_CARE_PROVIDER_SITE_OTHER): Payer: 59 | Admitting: Family Medicine

## 2014-05-25 VITALS — BP 139/95 | HR 77 | Temp 98.6°F | Ht 62.0 in | Wt 191.0 lb

## 2014-05-25 DIAGNOSIS — K098 Other cysts of oral region, not elsewhere classified: Secondary | ICD-10-CM | POA: Diagnosis not present

## 2014-05-25 DIAGNOSIS — E049 Nontoxic goiter, unspecified: Secondary | ICD-10-CM

## 2014-05-25 NOTE — Progress Notes (Signed)
Pre visit review using our clinic review tool, if applicable. No additional management support is needed unless otherwise documented below in the visit note. 

## 2014-05-25 NOTE — Progress Notes (Signed)
   Subjective:    Patient ID: ENEDINA PAIR, female    DOB: 01/30/1965, 49 y.o.   MRN: [id number]  HPI Here for one week of discomfort in the anterior neck. No swelling or lymph node enlargement. No systemic symptoms like sweats or weight change or heart racing. The pain is much better today. Also she has had a tiny mass on the left lower gum line for several years. This gets larger or smaller at times, but never goes away. This is not tender. Of note her aunt was diagnosed with thyroid cancer last week.    Review of Systems  Constitutional: Negative.   HENT: Negative for sore throat, trouble swallowing and voice change.   Endocrine: Negative.   Musculoskeletal: Positive for neck pain.       Objective:   Physical Exam  Constitutional: She appears well-developed and well-nourished.  HENT:  There is a tiny firm round papular lesion on the left lower gum at the tooth line   Neck:  Both thyroid lobes are enlarged, the right lobe being larger than the left, this is not tender, no nodules felt  Lymphadenopathy:    She has no cervical adenopathy.          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 This appears to be benign and we agreed to observe it for now. Her dentist is aware of this as well and is monitoring it. On the other hand her thyroid gland is enlarged and her neck pain may have been from acute inflammation of the gland. We will get thyroid labs and set up a thyroid US soon.

## 2014-05-26 LAB — TSH: TSH: 1.4 u[IU]/mL (ref 0.35–4.50)

## 2014-05-26 LAB — T4, FREE: FREE T4: 0.74 ng/dL (ref 0.60–1.60)

## 2014-05-26 LAB — T3, FREE: T3, Free: 3.1 pg/mL (ref 2.3–4.2)

## 2014-05-27 ENCOUNTER — Ambulatory Visit
Admission: RE | Admit: 2014-05-27 | Discharge: 2014-05-27 | Disposition: A | Discharge status: Home / Self Care | Payer: 59 | Source: Ambulatory Visit | Attending: Family Medicine | Admitting: Family Medicine

## 2014-05-27 DIAGNOSIS — E049 Nontoxic goiter, unspecified: Secondary | ICD-10-CM

## 2014-06-28 ENCOUNTER — Ambulatory Visit (INDEPENDENT_AMBULATORY_CARE_PROVIDER_SITE_OTHER): Payer: 59 | Admitting: Family Medicine

## 2014-06-28 ENCOUNTER — Encounter: Payer: Self-pay | Admitting: Family Medicine

## 2014-06-28 VITALS — BP 124/68 | HR 89 | Temp 98.6°F | Wt 192.9 lb

## 2014-06-28 DIAGNOSIS — H109 Unspecified conjunctivitis: Secondary | ICD-10-CM

## 2014-06-28 DIAGNOSIS — J0100 Acute maxillary sinusitis, unspecified: Secondary | ICD-10-CM

## 2014-06-28 MED ORDER — AZITHROMYCIN 250 MG PO TABS: ORAL_TABLET | ORAL | Status: DC

## 2014-06-28 MED ORDER — TOBRAMYCIN-DEXAMETHASONE 0.3-0.1 % OP SUSP: 2.0000 [drp] | OPHTHALMIC | Status: DC

## 2014-06-28 NOTE — Progress Notes (Signed)
   Subjective:    Patient ID: Kristina Gillespie, female    DOB: 06-03-1965, 49 y.o.   MRN: [id number]  HPI Here for 3 days of sinus pressure, left ear pain, and PND. No cough. Also her left eye has been red and has had a crusty DC.    Review of Systems  Constitutional: Negative.   HENT: Positive for congestion, ear pain and sinus pressure.   Eyes: Positive for discharge, redness and itching.  Respiratory: Negative.        Objective:   Physical Exam  Constitutional: She appears well-developed and well-nourished.  HENT:  Right Ear: External ear normal.  Left Ear: External ear normal.  Nose: Nose normal.  Mouth/Throat: Oropharynx is clear and moist.  Eyes: Pupils are equal, round, and reactive to light.  Left conjunctiva is pink and both lids are slightly swollen. The right eye is clear.   Pulmonary/Chest: Effort normal and breath sounds normal.  Lymphadenopathy:    She has no cervical adenopathy.          Assessment & Plan:  She has a sinusitis and a conjunctivitis. Treat with a Zpack and drops.

## 2014-06-28 NOTE — Progress Notes (Signed)
Pre visit review using our clinic review tool, if applicable. No additional management support is needed unless otherwise documented below in the visit note. 

## 2014-08-10 ENCOUNTER — Other Ambulatory Visit: Payer: Self-pay | Admitting: Family Medicine

## 2014-08-11 NOTE — Telephone Encounter (Signed)
Can we refill this? 

## 2014-09-09 ENCOUNTER — Other Ambulatory Visit (INDEPENDENT_AMBULATORY_CARE_PROVIDER_SITE_OTHER): Payer: 59

## 2014-09-09 DIAGNOSIS — Z0000 Encounter for general adult medical examination without abnormal findings: Secondary | ICD-10-CM | POA: Diagnosis not present

## 2014-09-09 LAB — CBC WITH DIFFERENTIAL/PLATELET
Basophils Absolute: 0 K/uL (ref 0.0–0.1)
Basophils Relative: 0.5 % (ref 0.0–3.0)
Eosinophils Absolute: 0.1 K/uL (ref 0.0–0.7)
Eosinophils Relative: 1.7 % (ref 0.0–5.0)
HCT: 39.7 % (ref 36.0–46.0)
Hemoglobin: 13.2 g/dL (ref 12.0–15.0)
Lymphocytes Relative: 37.8 % (ref 12.0–46.0)
Lymphs Abs: 3.1 K/uL (ref 0.7–4.0)
MCHC: 33.3 g/dL (ref 30.0–36.0)
MCV: 87.3 fl (ref 78.0–100.0)
Monocytes Absolute: 0.3 K/uL (ref 0.1–1.0)
Monocytes Relative: 4 % (ref 3.0–12.0)
Neutro Abs: 4.6 K/uL (ref 1.4–7.7)
Neutrophils Relative %: 56 % (ref 43.0–77.0)
Platelets: 226 K/uL (ref 150.0–400.0)
RBC: 4.54 Mil/uL (ref 3.87–5.11)
RDW: 13.5 % (ref 11.5–15.5)
WBC: 8.2 K/uL (ref 4.0–10.5)

## 2014-09-09 LAB — BASIC METABOLIC PANEL WITH GFR
BUN: 9 mg/dL (ref 6–23)
CO2: 31 meq/L (ref 19–32)
Calcium: 9.3 mg/dL (ref 8.4–10.5)
Chloride: 105 meq/L (ref 96–112)
Creatinine, Ser: 0.59 mg/dL (ref 0.40–1.20)
GFR: 138.79 mL/min
Glucose, Bld: 93 mg/dL (ref 70–99)
Potassium: 3.5 meq/L (ref 3.5–5.1)
Sodium: 141 meq/L (ref 135–145)

## 2014-09-09 LAB — LIPID PANEL
CHOLESTEROL: 173 mg/dL (ref 0–200)
HDL: 51.9 mg/dL (ref >39.00)
LDL Cholesterol: 101 mg/dL — ABNORMAL HIGH (ref 0–99)
NONHDL: 120.71
Total CHOL/HDL Ratio: 3
Triglycerides: 101 mg/dL (ref 0.0–149.0)
VLDL: 20.2 mg/dL (ref 0.0–40.0)

## 2014-09-09 LAB — POCT URINALYSIS DIPSTICK
Bilirubin, UA: NEGATIVE
Glucose, UA: NEGATIVE
KETONES UA: NEGATIVE
Leukocytes, UA: NEGATIVE
Nitrite, UA: NEGATIVE
PH UA: 7.5
PROTEIN UA: NEGATIVE
RBC UA: NEGATIVE
SPEC GRAV UA: 1.02
UROBILINOGEN UA: 0.2

## 2014-09-09 LAB — HEPATIC FUNCTION PANEL
ALT: 14 U/L (ref 0–35)
AST: 15 U/L (ref 0–37)
Albumin: 4.1 g/dL (ref 3.5–5.2)
Alkaline Phosphatase: 56 U/L (ref 39–117)
BILIRUBIN DIRECT: 0.1 mg/dL (ref 0.0–0.3)
Total Bilirubin: 0.4 mg/dL (ref 0.2–1.2)
Total Protein: 7 g/dL (ref 6.0–8.3)

## 2014-09-09 LAB — TSH: TSH: 1.08 u[IU]/mL (ref 0.35–4.50)

## 2014-09-13 ENCOUNTER — Encounter: Payer: Self-pay | Admitting: Family Medicine

## 2014-09-13 ENCOUNTER — Ambulatory Visit (INDEPENDENT_AMBULATORY_CARE_PROVIDER_SITE_OTHER): Payer: 59 | Admitting: Family Medicine

## 2014-09-13 VITALS — BP 138/86 | HR 77 | Temp 98.2°F | Ht 62.0 in | Wt 193.0 lb

## 2014-09-13 DIAGNOSIS — Z0000 Encounter for general adult medical examination without abnormal findings: Secondary | ICD-10-CM

## 2014-09-13 MED ORDER — POTASSIUM CHLORIDE ER 10 MEQ PO TBCR: 20.0000 meq | EXTENDED_RELEASE_TABLET | Freq: Two times a day (BID) | ORAL | Status: DC

## 2014-09-13 MED ORDER — HYDROCHLOROTHIAZIDE 12.5 MG PO CAPS: 12.5000 mg | ORAL_CAPSULE | Freq: Every day | ORAL | Status: DC

## 2014-09-13 MED ORDER — AMLODIPINE BESYLATE 5 MG PO TABS: 5.0000 mg | ORAL_TABLET | Freq: Every day | ORAL | Status: DC

## 2014-09-13 NOTE — Progress Notes (Signed)
   Subjective:    Patient ID: Kristina Gillespie, female    DOB: [birth year], 49 y.o.   MRN: [id number]  HPI 49 yr old female for a cpx. She feels well. Her BP is stable.    Review of Systems  Constitutional: Negative.   HENT: Negative.   Eyes: Negative.   Respiratory: Negative.   Cardiovascular: Negative.   Gastrointestinal: Negative.   Genitourinary: Negative for dysuria, urgency, frequency, hematuria, flank pain, decreased urine volume, enuresis, difficulty urinating, pelvic pain and dyspareunia.  Musculoskeletal: Negative.   Skin: Negative.   Neurological: Negative.   Psychiatric/Behavioral: Negative.        Objective:   Physical Exam  Constitutional: She is oriented to person, place, and time. She appears well-developed and well-nourished. No distress.  HENT:  Head: Normocephalic and atraumatic.  Right Ear: External ear normal.  Left Ear: External ear normal.  Nose: Nose normal.  Mouth/Throat: Oropharynx is clear and moist. No oropharyngeal exudate.  Eyes: Conjunctivae and EOM are normal. Pupils are equal, round, and reactive to light. No scleral icterus.  Neck: Normal range of motion. Neck supple. No JVD present. No thyromegaly present.  Cardiovascular: Normal rate, regular rhythm, normal heart sounds and intact distal pulses.  Exam reveals no gallop and no friction rub.   No murmur heard. Pulmonary/Chest: Effort normal and breath sounds normal. No respiratory distress. She has no wheezes. She has no rales. She exhibits no tenderness.  Abdominal: Soft. Bowel sounds are normal. She exhibits no distension and no mass. There is no tenderness. There is no rebound and no guarding.  Musculoskeletal: Normal range of motion. She exhibits no edema or tenderness.  Lymphadenopathy:    She has no cervical adenopathy.  Neurological: She is alert and oriented to person, place, and time. She has normal reflexes. No cranial nerve deficit. She exhibits normal muscle tone. Coordination  normal.  Skin: Skin is warm and dry. No rash noted. No erythema.  Psychiatric: She has a normal mood and affect. Her behavior is normal. Judgment and thought content normal.          Assessment & Plan:  Well exam. We discussed diet and exercise advice. Set up a colonoscopy.

## 2014-09-13 NOTE — Progress Notes (Signed)
Pre visit review using our clinic review tool, if applicable. No additional management support is needed unless otherwise documented below in the visit note. 

## 2014-09-16 ENCOUNTER — Other Ambulatory Visit: Payer: Self-pay | Admitting: Family Medicine

## 2014-09-26 ENCOUNTER — Other Ambulatory Visit: Payer: Self-pay | Admitting: Family Medicine

## 2015-02-24 ENCOUNTER — Encounter: Payer: Self-pay | Admitting: Family Medicine

## 2015-02-24 ENCOUNTER — Ambulatory Visit (INDEPENDENT_AMBULATORY_CARE_PROVIDER_SITE_OTHER): Payer: 59 | Admitting: Family Medicine

## 2015-02-24 VITALS — BP 130/84 | HR 99 | Temp 98.6°F | Wt 193.1 lb

## 2015-02-24 DIAGNOSIS — M545 Low back pain, unspecified: Secondary | ICD-10-CM

## 2015-02-24 DIAGNOSIS — E049 Nontoxic goiter, unspecified: Secondary | ICD-10-CM

## 2015-02-24 DIAGNOSIS — M542 Cervicalgia: Secondary | ICD-10-CM

## 2015-02-24 NOTE — Progress Notes (Signed)
Pre visit review using our clinic review tool, if applicable. No additional management support is needed unless otherwise documented below in the visit note. 

## 2015-02-24 NOTE — Patient Instructions (Addendum)
Please follow up with PCP in May as scheduled.    When working at the computer for an extended period of time, please stand up and stretch 4 times/hour.  Letter for ergonomic chair has been provided today per your request.  Continue using computer with screen elevated at eye level to reduce neck pain and you can find a neck pillow for support at a local store to help with sleep at night.  If symptoms of neck pain do not resolve with supportive, ergonomic measures, and tylenol in 1-2 weeks, please return to clinic for further evaluation.  If back pain becomes constant, worsens, or you develop numbness/tingling please contact clinic for further evaluation.  Goiter A goiter is an enlarged thyroid gland. The thyroid gland is located in the lower front of the neck. The gland produces hormones that regulate mood, body temperature, pulse rate, and digestion. Most goiters are painless and are not a cause for serious concern. Goiters and conditions that cause goiters can be treated, if necessary. CAUSES Causes of this condition include:  Diseases that attack healthy cells in your body (autoimmune diseases) and affect your thyroid function, such as:  Graves disease. This causes too much thyroid hormone to be produced and it makes your thyroid overly active (hyperthyroidism).  Hashimoto disease. This type of inflammation of the thyroid (thyroiditis) causes too little thyroid hormone to be produced and it makes your thyroid not active enough (hypothyroidism).  Other conditions that cause thyroiditis.  Nodular goiter. This means that there are one or more small growths on your thyroid. These can create too much thyroid hormone.  Pregnancy.  Thyroid cancer. This is rare.  Certain medicines.  Radiation exposure.  Iodine deficiency. In some cases, the cause may not be known (idiopathic). RISK FACTORS This condition is more likely to develop in:  People who have a family history of  goiter.  Women.  People who do not get enough iodine in their diet.  People who are older than 10.  People who smoke tobacco. SYMPTOMS Common symptoms of this condition include:  Swelling in the lower part of the neck. This swelling can range from a very small bump to a large lump.  A tight feeling in the throat.  A hoarse voice. Other symptoms include:   Coughing.  Wheezing.  Difficulty swallowing.  Difficulty breathing.  Bulging neck veins.  Dizziness. In some cases, there are no symptoms and thyroid hormone levels may be normal. When a goiter is the result of hyperthyroidism, symptoms may also include:  Nervousness or restlessness.  Inability to tolerate heat.  Unexplained weight loss.  Diarrhea.  Change in the texture of hair or skin.  Changes in heart beat, such as skipped beats, extra beats, or a rapid heart rate.  Loss of menstruation.  Shaky hands.  Increased appetite.  Sleep problems. When a goiter is the result of hypothyroidism, symptoms may also include:  Feeling like you have no energy (lethargy).  Inability to tolerate cold.  Weight gain that is not explained by a change in diet or exercise habits.  Dry skin.  Coarse hair.  Menstrual irregularity.  Constipation.  Sadness or depression. DIAGNOSIS This condition may be diagnosed with a medical history and physical exam. You may also have other tests, including:  Blood tests to check thyroid function.  Imaging tests, such as:  Ultrasonography.  CT scan.  MRI.  Thyroid scan. You will be given a safe radioactive injection, then images will be taken of your thyroid.  Tissue sample (biopsy) of the goiter or any nodules. This checks to see if the goiter or nodules are cancerous. TREATMENT Treatment for this condition depends on the cause. Treatment may include:  Medicines to control your thyroid.  Anti-inflammatory or steroid medicines, if inflammation is the  cause.  Iodine supplements or changes in diet, if the goiter is caused by iodine deficiency.  Radiation therapy.  Surgery to remove your thyroid. In some cases, no treatment is necessary, and your health care provider will monitor your condition at regular checkups. HOME CARE INSTRUCTIONS  Follow recommendations from your health care provider for any changes to your diet.  Take over-the-counter and prescription medicines only as told by your health care provider.  Do not use any tobacco products, including cigarettes, chewing tobacco, or e-cigarettes. If you need help quitting, ask your health care provider.  Keep all follow-up appointments as told by your health care provider. This is important. SEEK MEDICAL CARE IF:  Your symptoms do not get better with treatment. SEEK IMMEDIATE MEDICAL CARE IF:  You develop sudden, unexplained confusion or other mental changes.  You have nausea, vomiting, or diarrhea.  You develop a fever.  Your skin or the whites of your eyes appear yellow (jaundice).  You develop chest pain.  You have trouble breathing or swallowing.  You suddenly become very weak.  You experience extreme restlessness.   This information is not intended to replace advice given to you by your health care provider. Make sure you discuss any questions you have with your health care provider.   Document Released: 06/28/2009 Document Revised: 05/25/2014 Document Reviewed: 01/04/2014 Elsevier Interactive Patient Education [employer].

## 2015-02-24 NOTE — Progress Notes (Signed)
Subjective:    Patient ID: Kristina Gillespie, female    DOB: 07-Nov-1965, 50 y.o.   MRN: [id number]  HPI  Kristina Gillespie is a 50 year old female patient who presents today with neck pain that started 4 days ago with no known trigger. She describes the pain as dull and notes that it is not constant.  When she notices the pain, she describes it as a 4-5, anterior over her "throat", dull, lasting for a few seconds.  This pain may or may not be associated with movement per patient.  There is no radiation of pain or radiculopathy noted. She denies any prior sore throat, sinus congestion/pressure, ear pain, sweats, weight changes, or heart racing. She notes that her work has been "extremely stressful" with extended hours for the past few weeks. Pertinent history includes goiter with thyroid nodules bilaterally noted on ultrasound 5/16.  Today, she states that thyroid area is not tender. See findings below for ultrasound 5/16.  FINDINGS: Right thyroid lobe  Measurements: 6.0 x 1.4 x 2.2 cm. Two punctate hypoechoic structures in the right thyroid lobe are compatible with small nodules, largest measures 0.3 cm.  Left thyroid lobe  Measurements: 5.2 x 1.3 x 1.9 cm. At least 2 small hypoechoic nodules in the left thyroid lobe, largest measuring 0.5 cm.  Isthmus  Thickness: 0.4 cm. No nodules visualized.  Lymphadenopathy  None visualized.  IMPRESSION: Small bilateral thyroid nodules, largest measuring 0.5 cm. These nodules do not meet criteria for biopsy. This recommendation follows the consensus statement: Management of Thyroid Nodules Detected at [place]: Society of Radiologists in [place]. Radiology 2005; [id number].  Back pain is reported by patient when working at her computer for extended periods of time.  She notes that the pain is dull, midline, and is exacerbated by long work hours. She denies any trigger for the pain, waking her from sleep,  radiation of pain, weakness, numbness, tingling in arms/legs, loss of bowel/bladder control, or past injury/surgery.   No treatments at home have been used for back and neck pain. She is requesting a note for an ergonomic chair at work.  Review of Systems  Constitutional: Negative for fever, chills, fatigue and unexpected weight change.  HENT: Negative.   Respiratory: Negative for cough, choking and shortness of breath.   Cardiovascular: Negative for chest pain and palpitations.  Endocrine: Negative.   Genitourinary: Negative for dysuria.  Musculoskeletal: Positive for back pain and neck pain.  Skin: Negative for color change and rash.  Neurological: Negative for dizziness, light-headedness, numbness and headaches.       No loss of bowel or bladder control   Past Medical History  Diagnosis Date  . Hypertension   . Heart murmur   . Anemia   . Hyperlipidemia   . Eczema   . Viral encephalitis     coma for 3 days  . Routine gynecological examination     sees Kristina Gillespie     Social History   Social History  . Marital Status: Married    Spouse Name: N/A  . Number of Children: N/A  . Years of Education: N/A   Occupational History  . Not on file.   Social History Main Topics  . Smoking status: Never Smoker   . Smokeless tobacco: Never Used  . Alcohol Use: No  . Drug Use: No  . Sexual Activity: Not on file   Other Topics Concern  . Not on file   Social History Narrative  Past Surgical History  Procedure Laterality Date  . Tubal ligation    . Tonsillectomy      No family history on file.  Allergies  Allergen Reactions  . Lisinopril     REACTION: cough    Current Outpatient Prescriptions on File Prior to Visit  Medication Sig Dispense Refill  . amLODipine (NORVASC) 5 MG tablet Take 1 tablet (5 mg total) by mouth daily. 90 tablet 3  . ferrous gluconate (FERGON) 324 MG tablet TAKE 1 TABLET EVERY DAY WITH BREAKFAST 90 tablet 3  . hydrochlorothiazide  (MICROZIDE) 12.5 MG capsule Take 1 capsule (12.5 mg total) by mouth daily. 90 capsule 3  . potassium chloride (KLOR-CON 10) 10 MEQ tablet Take 2 tablets (20 mEq total) by mouth 2 (two) times daily. 360 tablet 3   No current facility-administered medications on file prior to visit.    BP 130/84 mmHg  Pulse 99  Temp(Src) 98.6 F (37 C) (Oral)  Wt 193 lb 1.6 oz (87.59 kg)  SpO2 100%  .     Objective:   Physical Exam  Constitutional: She is oriented to person, place, and time. She appears well-developed and well-nourished.  Neck: Normal range of motion. Neck supple.  Both thyroid lobes enlarged. Right lobe is larger than left lobe which is unchanged since 5/16  Cardiovascular: Intact distal pulses.   Abdominal: Soft. There is no tenderness.  Musculoskeletal: Normal range of motion. She exhibits no edema.  No discomfort/pain upon palpation. Negative SLR. Patient is able to heel/toe walk without difficulty. Pain is not present currently. Lower extremity strength equal bilaterally 5/5  Lymphadenopathy:    She has no cervical adenopathy.  Neurological: She is alert and oriented to person, place, and time.  Skin: Skin is warm and dry. No rash noted.  Psychiatric: She has a normal mood and affect. Her behavior is normal.       Assessment & Plan:  1. Goiter  Encouraged patient to follow up with PCP in May as scheduled due to unchanged status of goiter at this time. TSH in 8/16 was 1.8 (WNL).   2. Neck pain  Patient will consider ergonomic changes when working for extended times at her computer. She will also consider using a neck pillow for support during sleep to avoid hyperextending neck.  Tylenol will be used for pain relief and she will follow up if she develops a tender thyroid or pain does not improve in 1-2 weeks, or worsens.  3. Midline low back pain without sciatica  History and exam consist with poor ergonomics and extended time in a sitting position in front of the computer.  Encouraged patient to take breaks every hour by standing and stretching at her workstation.  Advised stretching at home and provided a note for patient to submit to employer regarding the use of an ergonomic chair at work.  If back pain persists, worsens, radiates, causes numbness/tinging. and conservative measures including ergonomic changes do not improve pain, further evaluation is indicated. Patient advised to contact clinic for further evaluation if any of the above symptoms occur or pain becomes constant.

## 2015-08-02 ENCOUNTER — Other Ambulatory Visit: Payer: Self-pay

## 2015-08-02 MED ORDER — HYDROCHLOROTHIAZIDE 12.5 MG PO CAPS: 12.5000 mg | ORAL_CAPSULE | Freq: Every day | ORAL | Status: DC

## 2015-08-02 MED ORDER — AMLODIPINE BESYLATE 5 MG PO TABS: 5.0000 mg | ORAL_TABLET | Freq: Every day | ORAL | Status: DC

## 2015-09-11 ENCOUNTER — Other Ambulatory Visit: Payer: Self-pay | Admitting: Family Medicine

## 2015-09-14 ENCOUNTER — Other Ambulatory Visit (INDEPENDENT_AMBULATORY_CARE_PROVIDER_SITE_OTHER): Payer: 59

## 2015-09-14 DIAGNOSIS — Z0000 Encounter for general adult medical examination without abnormal findings: Secondary | ICD-10-CM | POA: Diagnosis not present

## 2015-09-14 LAB — CBC WITH DIFFERENTIAL/PLATELET
BASOS PCT: 0.3 % (ref 0.0–3.0)
Basophils Absolute: 0 10*3/uL (ref 0.0–0.1)
EOS PCT: 1.6 % (ref 0.0–5.0)
Eosinophils Absolute: 0.1 10*3/uL (ref 0.0–0.7)
HEMATOCRIT: 40.8 % (ref 36.0–46.0)
HEMOGLOBIN: 13.8 g/dL (ref 12.0–15.0)
Lymphocytes Relative: 34.8 % (ref 12.0–46.0)
Lymphs Abs: 3 10*3/uL (ref 0.7–4.0)
MCHC: 33.9 g/dL (ref 30.0–36.0)
MCV: 85.2 fl (ref 78.0–100.0)
MONO ABS: 0.3 10*3/uL (ref 0.1–1.0)
Monocytes Relative: 3.3 % (ref 3.0–12.0)
Neutro Abs: 5.1 10*3/uL (ref 1.4–7.7)
Neutrophils Relative %: 60 % (ref 43.0–77.0)
Platelets: 214 10*3/uL (ref 150.0–400.0)
RBC: 4.79 Mil/uL (ref 3.87–5.11)
RDW: 13.6 % (ref 11.5–15.5)
WBC: 8.5 10*3/uL (ref 4.0–10.5)

## 2015-09-14 LAB — POC URINALSYSI DIPSTICK (AUTOMATED)
Bilirubin, UA: NEGATIVE
Glucose, UA: NEGATIVE
Ketones, UA: NEGATIVE
Nitrite, UA: NEGATIVE
PH UA: 7
Protein, UA: NEGATIVE
RBC UA: NEGATIVE
SPEC GRAV UA: 1.015
Urobilinogen, UA: 0.2

## 2015-09-14 LAB — BASIC METABOLIC PANEL
BUN: 8 mg/dL (ref 6–23)
CHLORIDE: 103 meq/L (ref 96–112)
CO2: 30 mEq/L (ref 19–32)
Calcium: 9.2 mg/dL (ref 8.4–10.5)
Creatinine, Ser: 0.66 mg/dL (ref 0.40–1.20)
GFR: 121.45 mL/min (ref >60.00)
Glucose, Bld: 88 mg/dL (ref 70–99)
POTASSIUM: 3.2 meq/L — AB (ref 3.5–5.1)
Sodium: 141 mEq/L (ref 135–145)

## 2015-09-14 LAB — HEPATIC FUNCTION PANEL
ALBUMIN: 4.4 g/dL (ref 3.5–5.2)
ALT: 14 U/L (ref 0–35)
AST: 14 U/L (ref 0–37)
Alkaline Phosphatase: 61 U/L (ref 39–117)
BILIRUBIN TOTAL: 0.7 mg/dL (ref 0.2–1.2)
Bilirubin, Direct: 0.1 mg/dL (ref 0.0–0.3)
TOTAL PROTEIN: 7.3 g/dL (ref 6.0–8.3)

## 2015-09-14 LAB — LIPID PANEL
CHOLESTEROL: 201 mg/dL — AB (ref 0–200)
HDL: 59 mg/dL (ref >39.00)
LDL CALC: 122 mg/dL — AB (ref 0–99)
NonHDL: 141.98
TRIGLYCERIDES: 98 mg/dL (ref 0.0–149.0)
Total CHOL/HDL Ratio: 3
VLDL: 19.6 mg/dL (ref 0.0–40.0)

## 2015-09-14 LAB — TSH: TSH: 0.99 u[IU]/mL (ref 0.35–4.50)

## 2015-09-19 ENCOUNTER — Ambulatory Visit (INDEPENDENT_AMBULATORY_CARE_PROVIDER_SITE_OTHER): Payer: 59 | Admitting: Family Medicine

## 2015-09-19 ENCOUNTER — Encounter: Payer: Self-pay | Admitting: Family Medicine

## 2015-09-19 VITALS — BP 130/80 | HR 72 | Temp 98.1°F | Ht 63.0 in | Wt 194.0 lb

## 2015-09-19 DIAGNOSIS — Z0000 Encounter for general adult medical examination without abnormal findings: Secondary | ICD-10-CM | POA: Diagnosis not present

## 2015-09-19 MED ORDER — POTASSIUM CHLORIDE ER 10 MEQ PO TBCR: 10.0000 meq | EXTENDED_RELEASE_TABLET | Freq: Two times a day (BID) | ORAL | 3 refills | Status: DC

## 2015-09-19 MED ORDER — AMLODIPINE BESYLATE 10 MG PO TABS: 10.0000 mg | ORAL_TABLET | Freq: Every day | ORAL | 3 refills | Status: DC

## 2015-09-19 MED ORDER — FERROUS GLUCONATE 324 (38 FE) MG PO TABS: ORAL_TABLET | ORAL | 3 refills | Status: DC

## 2015-09-19 NOTE — Progress Notes (Signed)
   Subjective:    Patient ID: Kristina Gillespie, female    DOB: 1965-12-19, 50 y.o.   MRN: [id number]  HPI 50 yr old female for a well exam. She feels fine except for occasional cramps in her feet. Her BP has been stable. She has had a problem with low potassium levels for years. Her level last week came back low at 3.2 despite taking 20 mEq of potassium bid.    Review of Systems  Constitutional: Negative.   HENT: Negative.   Eyes: Negative.   Respiratory: Negative.   Cardiovascular: Negative.   Gastrointestinal: Negative.   Genitourinary: Negative for decreased urine volume, difficulty urinating, dyspareunia, dysuria, enuresis, flank pain, frequency, hematuria, pelvic pain and urgency.  Musculoskeletal: Negative.   Skin: Negative.   Neurological: Negative.   Psychiatric/Behavioral: Negative.        Objective:   Physical Exam  Constitutional: She is oriented to person, place, and time. She appears well-developed and well-nourished. No distress.  HENT:  Head: Normocephalic and atraumatic.  Right Ear: External ear normal.  Left Ear: External ear normal.  Nose: Nose normal.  Mouth/Throat: Oropharynx is clear and moist. No oropharyngeal exudate.  Eyes: Conjunctivae and EOM are normal. Pupils are equal, round, and reactive to light. No scleral icterus.  Neck: Normal range of motion. Neck supple. No JVD present. No thyromegaly present.  Cardiovascular: Normal rate, regular rhythm, normal heart sounds and intact distal pulses.  Exam reveals no gallop and no friction rub.   No murmur heard. EKG normal   Pulmonary/Chest: Effort normal and breath sounds normal. No respiratory distress. She has no wheezes. She has no rales. She exhibits no tenderness.  Abdominal: Soft. Bowel sounds are normal. She exhibits no distension and no mass. There is no tenderness. There is no rebound and no guarding.  Musculoskeletal: Normal range of motion. She exhibits no edema or tenderness.  Lymphadenopathy:      She has no cervical adenopathy.  Neurological: She is alert and oriented to person, place, and time. She has normal reflexes. No cranial nerve deficit. She exhibits normal muscle tone. Coordination normal.  Skin: Skin is warm and dry. No rash noted. No erythema.  Psychiatric: She has a normal mood and affect. Her behavior is normal. Judgment and thought content normal.          Assessment & Plan:  Well exam. We discussed diet and exercise. To stop the hypokalemia we will stop her HCTZ completely, and we will  Increase the Amlodipine to 10 mg daily. She will decrease the Klor-con to 10 mEq bid. Recheck in one month. Hopefully we can stop the potassium at some point.

## 2015-09-19 NOTE — Progress Notes (Signed)
Pre visit review using our clinic review tool, if applicable. No additional management support is needed unless otherwise documented below in the visit note. 

## 2015-10-18 ENCOUNTER — Other Ambulatory Visit: Payer: Self-pay | Admitting: Family Medicine

## 2015-10-20 ENCOUNTER — Encounter: Payer: Self-pay | Admitting: Family Medicine

## 2015-10-20 ENCOUNTER — Ambulatory Visit (INDEPENDENT_AMBULATORY_CARE_PROVIDER_SITE_OTHER): Payer: Commercial Managed Care - PPO | Admitting: Family Medicine

## 2015-10-20 VITALS — BP 113/85 | HR 78 | Temp 98.2°F | Ht 63.0 in | Wt 192.0 lb

## 2015-10-20 DIAGNOSIS — E876 Hypokalemia: Secondary | ICD-10-CM | POA: Diagnosis not present

## 2015-10-20 DIAGNOSIS — I10 Essential (primary) hypertension: Secondary | ICD-10-CM | POA: Diagnosis not present

## 2015-10-20 LAB — BASIC METABOLIC PANEL
BUN: 7 mg/dL (ref 6–23)
CO2: 30 meq/L (ref 19–32)
CREATININE: 0.66 mg/dL (ref 0.40–1.20)
Calcium: 9.1 mg/dL (ref 8.4–10.5)
Chloride: 105 mEq/L (ref 96–112)
GFR: 121.41 mL/min (ref >60.00)
GLUCOSE: 81 mg/dL (ref 70–99)
Potassium: 3.6 mEq/L (ref 3.5–5.1)
Sodium: 143 mEq/L (ref 135–145)

## 2015-10-20 NOTE — Progress Notes (Signed)
   Subjective:    Patient ID: Kristina Gillespie, female    DOB: 07/30/1965, 50 y.o.   MRN: [id number]  HPI Here to follow up on BP and potassium. One month ago we stopped her HCTZ and she has done well since then. She feels good and the BP has been stable.   Review of Systems  Constitutional: Negative.   Respiratory: Negative.   Cardiovascular: Negative.   Neurological: Negative.        Objective:   Physical Exam  Constitutional: She is oriented to person, place, and time. She appears well-developed and well-nourished.  Neck: No thyromegaly present.  Cardiovascular: Normal rate, regular rhythm, normal heart sounds and intact distal pulses.   Pulmonary/Chest: Effort normal and breath sounds normal.  Musculoskeletal: She exhibits no edema.  Lymphadenopathy:    She has no cervical adenopathy.  Neurological: She is alert and oriented to person, place, and time.          Assessment & Plan:  Her HTN is stable. No signs of edema even though we stopped the diuretic. Check a BMET.  Laurey Morale, MD

## 2015-10-20 NOTE — Progress Notes (Signed)
Pre visit review using our clinic review tool, if applicable. No additional management support is needed unless otherwise documented below in the visit note. 

## 2015-10-25 ENCOUNTER — Encounter: Payer: Self-pay | Admitting: Family Medicine

## 2015-10-26 NOTE — Telephone Encounter (Signed)
I spoke with pt and went over results. 

## 2015-12-07 ENCOUNTER — Ambulatory Visit (INDEPENDENT_AMBULATORY_CARE_PROVIDER_SITE_OTHER): Payer: Commercial Managed Care - PPO | Admitting: Family Medicine

## 2015-12-07 ENCOUNTER — Encounter: Payer: Self-pay | Admitting: Family Medicine

## 2015-12-07 VITALS — BP 130/88 | HR 88 | Temp 98.2°F | Ht 63.0 in | Wt 194.0 lb

## 2015-12-07 DIAGNOSIS — E876 Hypokalemia: Secondary | ICD-10-CM

## 2015-12-07 DIAGNOSIS — J40 Bronchitis, not specified as acute or chronic: Secondary | ICD-10-CM

## 2015-12-07 DIAGNOSIS — I10 Essential (primary) hypertension: Secondary | ICD-10-CM | POA: Diagnosis not present

## 2015-12-07 MED ORDER — AZITHROMYCIN 250 MG PO TABS: ORAL_TABLET | ORAL | 0 refills | Status: DC

## 2015-12-07 NOTE — Progress Notes (Signed)
Pre visit review using our clinic review tool, if applicable. No additional management support is needed unless otherwise documented below in the visit note. 

## 2015-12-07 NOTE — Progress Notes (Signed)
   Subjective:    Patient ID: Kristina Gillespie, female    DOB: 25-Dec-1965, 50 y.o.   MRN: [id number]  HPI Here for 5 days of chest tightness and coughing up yellow sputum. No fever. On Dayquil.    Review of Systems  Constitutional: Negative.   HENT: Negative.   Eyes: Negative.   Respiratory: Positive for cough and chest tightness. Negative for shortness of breath and wheezing.   Cardiovascular: Negative.        Objective:   Physical Exam  Constitutional: She appears well-developed and well-nourished.  HENT:  Right Ear: External ear normal.  Left Ear: External ear normal.  Nose: Nose normal.  Mouth/Throat: Oropharynx is clear and moist.  Eyes: Conjunctivae are normal.  Neck: No thyromegaly present.  Cardiovascular: Normal rate, regular rhythm, normal heart sounds and intact distal pulses.   Pulmonary/Chest: Effort normal. No respiratory distress. She has no wheezes. She has no rales.  Scattered rhonchi   Lymphadenopathy:    She has no cervical adenopathy.          Assessment & Plan:  Bronchitis, treat with a Zpack. Check another potassium level today.  Laurey Morale, MD

## 2015-12-08 LAB — BASIC METABOLIC PANEL
BUN: 6 mg/dL (ref 6–23)
CO2: 32 mEq/L (ref 19–32)
Calcium: 9.8 mg/dL (ref 8.4–10.5)
Chloride: 102 mEq/L (ref 96–112)
Creatinine, Ser: 0.76 mg/dL (ref 0.40–1.20)
GFR: 103.11 mL/min (ref >60.00)
Glucose, Bld: 80 mg/dL (ref 70–99)
POTASSIUM: 3.7 meq/L (ref 3.5–5.1)
SODIUM: 142 meq/L (ref 135–145)

## 2016-06-11 ENCOUNTER — Other Ambulatory Visit: Payer: Self-pay

## 2016-06-11 ENCOUNTER — Telehealth: Payer: Self-pay | Admitting: Family Medicine

## 2016-06-11 MED ORDER — TRIAMCINOLONE ACETONIDE 0.1 % EX CREA: 1.0000 "application " | TOPICAL_CREAM | Freq: Two times a day (BID) | CUTANEOUS | 2 refills | Status: DC

## 2016-06-11 NOTE — Telephone Encounter (Signed)
Dr. Sarajane Jews, is this ok?

## 2016-06-11 NOTE — Telephone Encounter (Signed)
Pt need new Rx for triamcinolone cream (pt state that she has a real bad rash that she needs to get in control of)  Pharm:  [facility] on [address]  Pt would like to have a call when this has been called in.

## 2016-06-11 NOTE — Telephone Encounter (Signed)
I have sent in rx as directed. Patient notified via voicemail. Thanks!

## 2016-06-11 NOTE — Telephone Encounter (Signed)
Call in Triamcinolone 0.1 % cream to apply bid as needed, 45 grams with 2 rf

## 2016-06-12 IMAGING — US US SOFT TISSUE HEAD/NECK
1 series · 14 of 25 positions shown · non-contrast
Comparison: None.

CLINICAL DATA: Goiter felt on physical examination.

EXAM:
THYROID ULTRASOUND
TECHNIQUE: Ultrasound examination of the thyroid gland and adjacent soft
tissues was performed.

[Series 1: us soft tissue head/neck · 0.06mm/px · 14 of 43 slices shown]
[im 1/43]
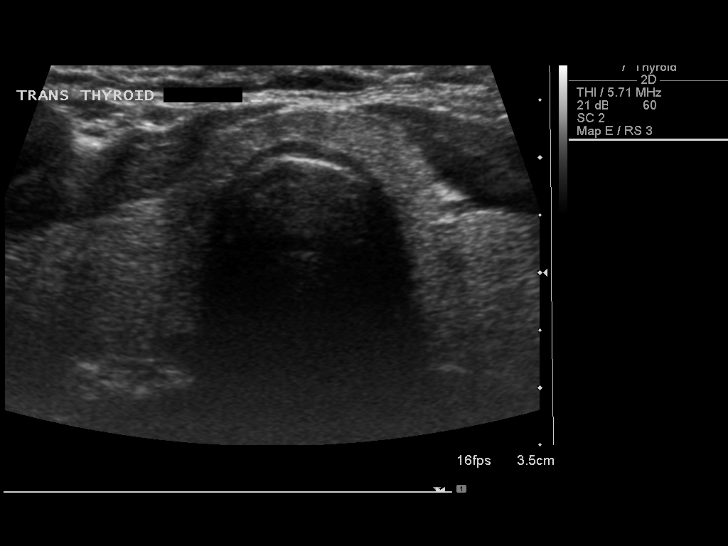
[im 4/43]
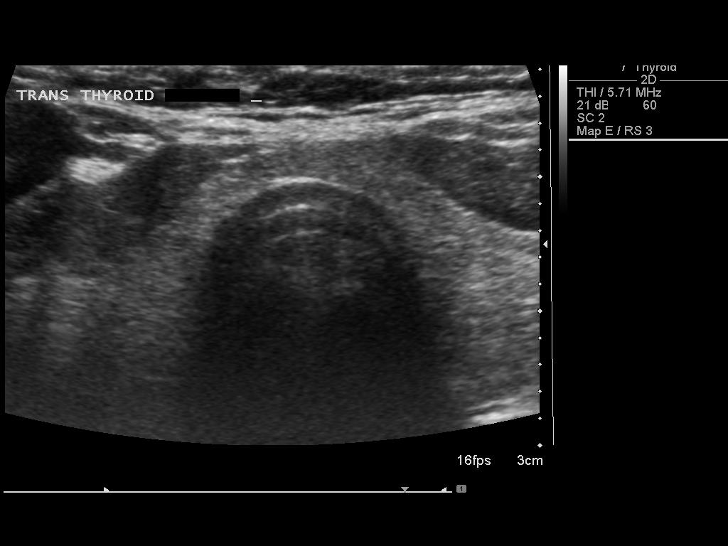
[im 8/43]
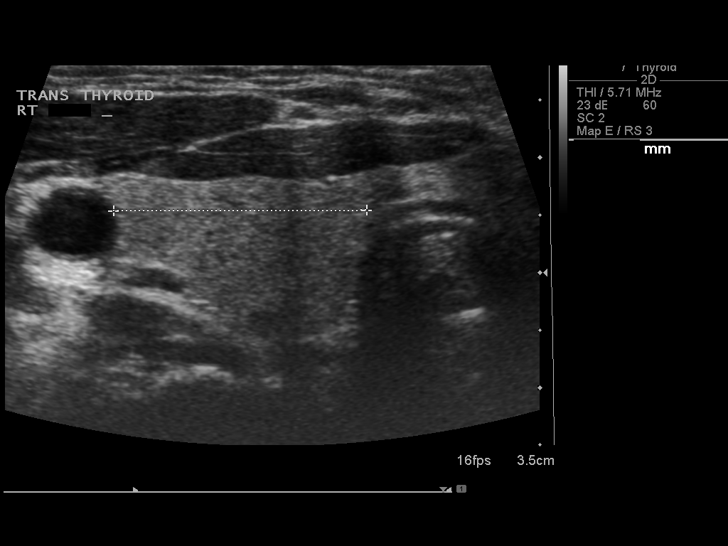
[im 11/43]
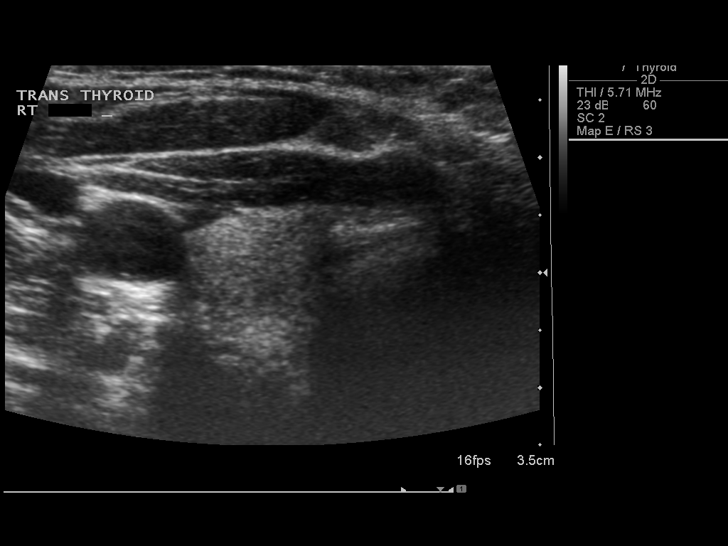
[im 15/43]
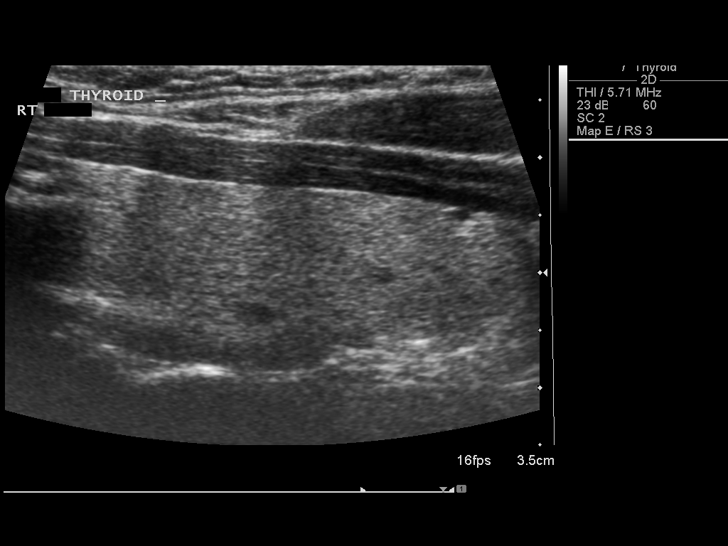
[im 16/43]
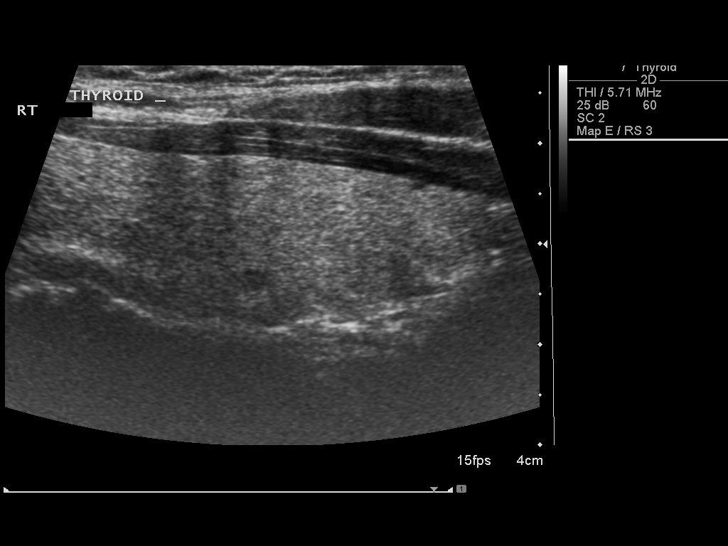
[im 20/43]
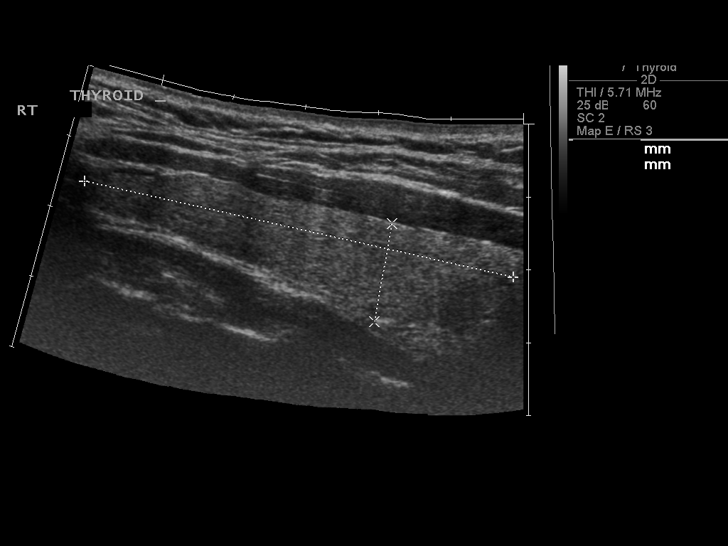
[im 23/43]
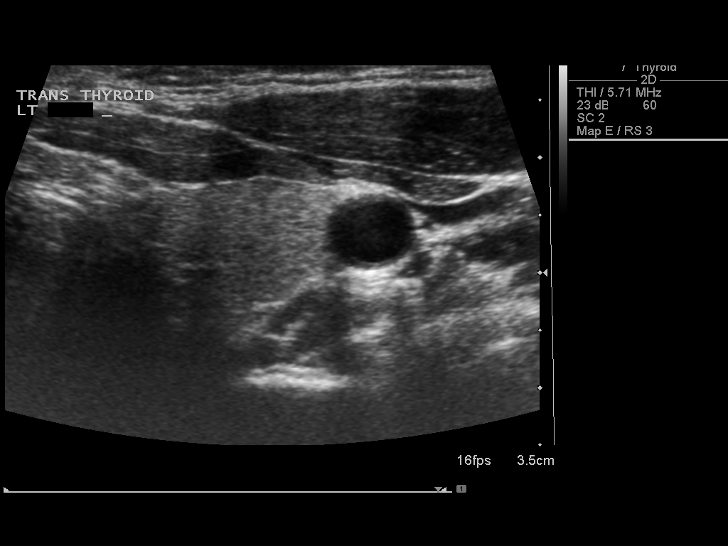
[im 27/43]
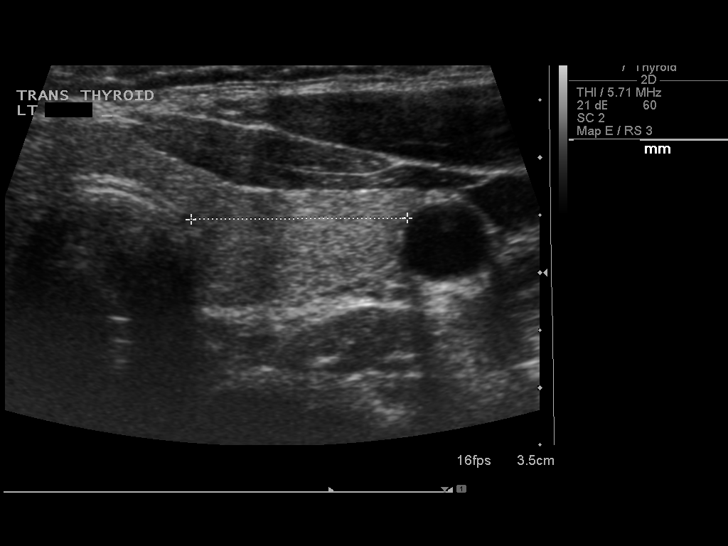
[im 29/43]
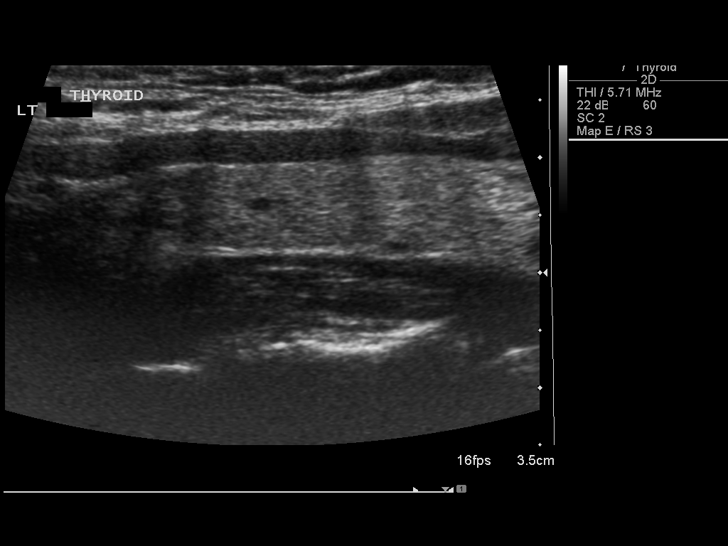
[im 32/43]
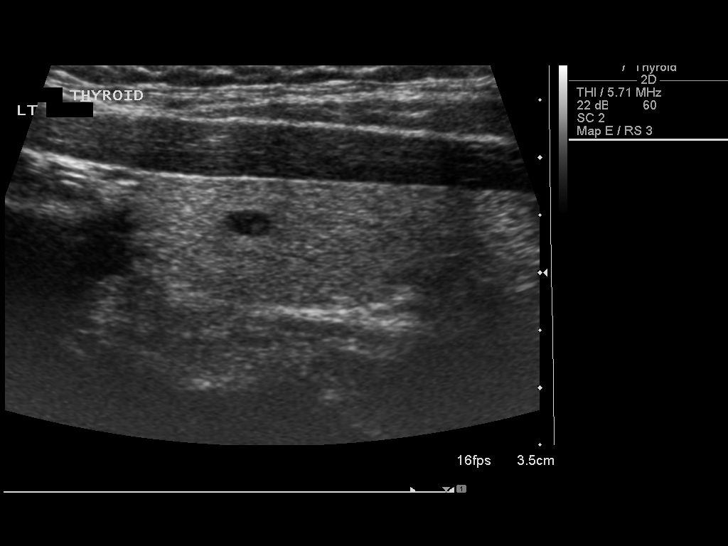
[im 36/43]
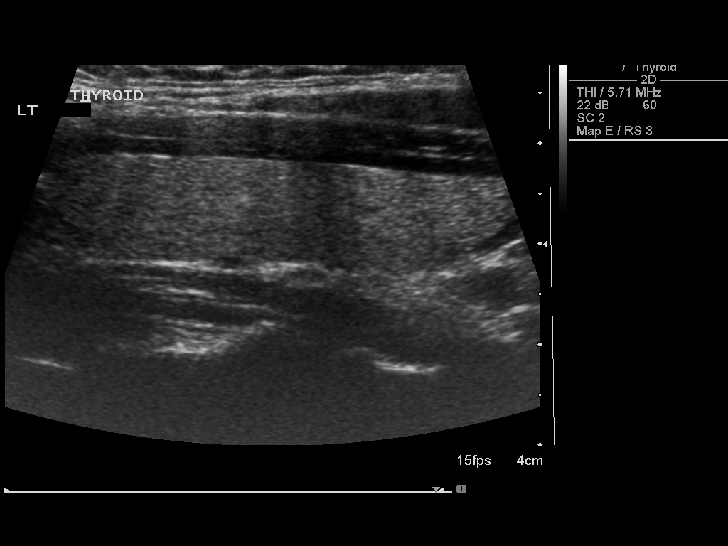
[im 39/43]
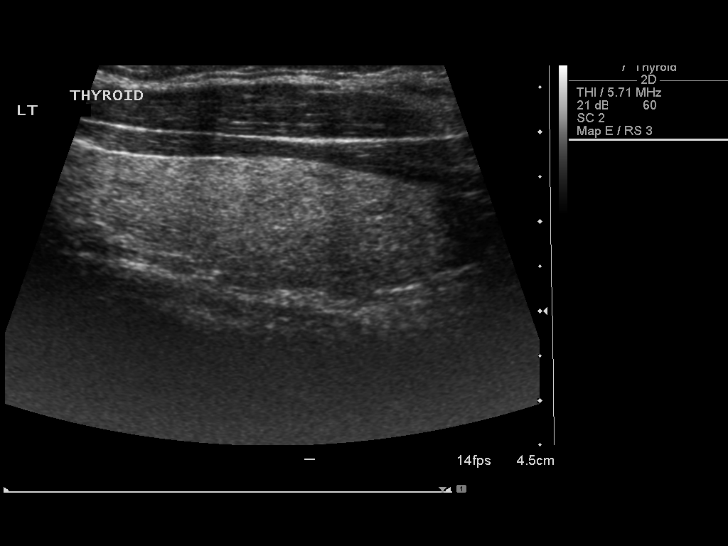
[im 43/43]
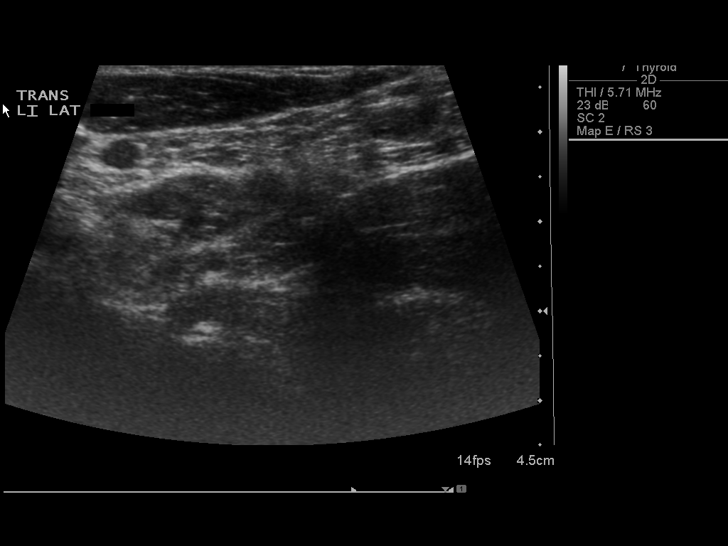

[14 of 25 positions shown; findings below may reference images not displayed]

FINDINGS: Right thyroid lobe

Measurements: 6.0 x 1.4 x 2.2 cm. Two punctate hypoechoic structures
in the right thyroid lobe are compatible with small nodules, largest
measures 0.3 cm.

Left thyroid lobe

Measurements: 5.2 x 1.3 x 1.9 cm. At least 2 small hypoechoic
nodules in the left thyroid lobe, largest measuring 0.5 cm.

Isthmus

Thickness: 0.4 cm.  No nodules visualized.

Lymphadenopathy

None visualized.
IMPRESSION: Small bilateral thyroid nodules, largest measuring 0.5 cm. These
nodules do not meet criteria for biopsy. This recommendation follows
the consensus statement: Management of Thyroid Nodules Detected at
[place]: Society of Radiologists in Ultrasound Consensus Conference

## 2016-08-27 ENCOUNTER — Encounter: Payer: Self-pay | Admitting: Gastroenterology

## 2016-09-27 ENCOUNTER — Ambulatory Visit (INDEPENDENT_AMBULATORY_CARE_PROVIDER_SITE_OTHER): Payer: Commercial Managed Care - PPO | Admitting: Family Medicine

## 2016-09-27 ENCOUNTER — Encounter: Payer: Self-pay | Admitting: Family Medicine

## 2016-09-27 VITALS — BP 121/88 | HR 75 | Temp 98.2°F | Ht 63.0 in | Wt 193.0 lb

## 2016-09-27 DIAGNOSIS — Z0000 Encounter for general adult medical examination without abnormal findings: Secondary | ICD-10-CM | POA: Diagnosis not present

## 2016-09-27 DIAGNOSIS — Z23 Encounter for immunization: Secondary | ICD-10-CM | POA: Diagnosis not present

## 2016-09-27 LAB — BASIC METABOLIC PANEL
BUN: 9 mg/dL (ref 6–23)
CO2: 32 meq/L (ref 19–32)
CREATININE: 0.61 mg/dL (ref 0.40–1.20)
Calcium: 9.6 mg/dL (ref 8.4–10.5)
Chloride: 103 mEq/L (ref 96–112)
GFR: 132.47 mL/min (ref >60.00)
GLUCOSE: 81 mg/dL (ref 70–99)
Potassium: 3.5 mEq/L (ref 3.5–5.1)
Sodium: 143 mEq/L (ref 135–145)

## 2016-09-27 LAB — LIPID PANEL
CHOLESTEROL: 206 mg/dL — AB (ref 0–200)
HDL: 59.3 mg/dL (ref >39.00)
LDL CALC: 128 mg/dL — AB (ref 0–99)
NONHDL: 146.24
Total CHOL/HDL Ratio: 3
Triglycerides: 93 mg/dL (ref 0.0–149.0)
VLDL: 18.6 mg/dL (ref 0.0–40.0)

## 2016-09-27 LAB — CBC WITH DIFFERENTIAL/PLATELET
BASOS PCT: 0.5 % (ref 0.0–3.0)
Basophils Absolute: 0 10*3/uL (ref 0.0–0.1)
EOS ABS: 0.1 10*3/uL (ref 0.0–0.7)
EOS PCT: 1.5 % (ref 0.0–5.0)
HEMATOCRIT: 42.2 % (ref 36.0–46.0)
HEMOGLOBIN: 13.7 g/dL (ref 12.0–15.0)
LYMPHS PCT: 39.5 % (ref 12.0–46.0)
Lymphs Abs: 2.8 10*3/uL (ref 0.7–4.0)
MCHC: 32.5 g/dL (ref 30.0–36.0)
MCV: 88.3 fl (ref 78.0–100.0)
MONO ABS: 0.3 10*3/uL (ref 0.1–1.0)
Monocytes Relative: 4.3 % (ref 3.0–12.0)
Neutro Abs: 3.9 10*3/uL (ref 1.4–7.7)
Neutrophils Relative %: 54.2 % (ref 43.0–77.0)
Platelets: 199 10*3/uL (ref 150.0–400.0)
RBC: 4.78 Mil/uL (ref 3.87–5.11)
RDW: 13.9 % (ref 11.5–15.5)
WBC: 7.1 10*3/uL (ref 4.0–10.5)

## 2016-09-27 LAB — POC URINALSYSI DIPSTICK (AUTOMATED)
Bilirubin, UA: NEGATIVE
Blood, UA: NEGATIVE
CLARITY UA: NEGATIVE
GLUCOSE UA: NEGATIVE
Ketones, UA: NEGATIVE
Leukocytes, UA: NEGATIVE
NITRITE UA: NEGATIVE
PROTEIN UA: NEGATIVE
SPEC GRAV UA: 1.015 (ref 1.010–1.025)
UROBILINOGEN UA: 0.2 U/dL
pH, UA: 8 (ref 5.0–8.0)

## 2016-09-27 LAB — HEPATIC FUNCTION PANEL
ALT: 15 U/L (ref 0–35)
AST: 16 U/L (ref 0–37)
Albumin: 4.3 g/dL (ref 3.5–5.2)
Alkaline Phosphatase: 79 U/L (ref 39–117)
BILIRUBIN DIRECT: 0.1 mg/dL (ref 0.0–0.3)
BILIRUBIN TOTAL: 0.6 mg/dL (ref 0.2–1.2)
TOTAL PROTEIN: 6.9 g/dL (ref 6.0–8.3)

## 2016-09-27 LAB — TSH: TSH: 0.74 u[IU]/mL (ref 0.35–4.50)

## 2016-09-27 MED ORDER — TRIAMCINOLONE ACETONIDE 0.1 % EX CREA: 1.0000 "application " | TOPICAL_CREAM | Freq: Two times a day (BID) | CUTANEOUS | 3 refills | Status: DC

## 2016-09-27 MED ORDER — AMLODIPINE BESYLATE 10 MG PO TABS: 10.0000 mg | ORAL_TABLET | Freq: Every day | ORAL | 3 refills | Status: DC

## 2016-09-27 MED ORDER — FERROUS GLUCONATE 324 (38 FE) MG PO TABS: ORAL_TABLET | ORAL | 3 refills | Status: DC

## 2016-09-27 NOTE — Progress Notes (Signed)
   Subjective:    Patient ID: Kristina Gillespie, female    DOB: 1965/03/18, 51 y.o.   MRN: [id number]  HPI Here for a well exam. She feels great. Her BP is stable. She will be having a colonoscopy per Dr. Loletha Carrow on 10-29-16.    Review of Systems  Constitutional: Negative.   HENT: Negative.   Eyes: Negative.   Respiratory: Negative.   Cardiovascular: Negative.   Gastrointestinal: Negative.   Genitourinary: Negative for decreased urine volume, difficulty urinating, dyspareunia, dysuria, enuresis, flank pain, frequency, hematuria, pelvic pain and urgency.  Musculoskeletal: Negative.   Skin: Negative.   Neurological: Negative.   Psychiatric/Behavioral: Negative.        Objective:   Physical Exam  Constitutional: She is oriented to person, place, and time. She appears well-developed and well-nourished. No distress.  HENT:  Head: Normocephalic and atraumatic.  Right Ear: External ear normal.  Left Ear: External ear normal.  Nose: Nose normal.  Mouth/Throat: Oropharynx is clear and moist. No oropharyngeal exudate.  Eyes: Pupils are equal, round, and reactive to light. Conjunctivae and EOM are normal. No scleral icterus.  Neck: Normal range of motion. Neck supple. No JVD present. No thyromegaly present.  Cardiovascular: Normal rate, regular rhythm, normal heart sounds and intact distal pulses.  Exam reveals no gallop and no friction rub.   No murmur heard. Pulmonary/Chest: Effort normal and breath sounds normal. No respiratory distress. She has no wheezes. She has no rales. She exhibits no tenderness.  Abdominal: Soft. Bowel sounds are normal. She exhibits no distension and no mass. There is no tenderness. There is no rebound and no guarding.  Musculoskeletal: Normal range of motion. She exhibits no edema or tenderness.  Lymphadenopathy:    She has no cervical adenopathy.  Neurological: She is alert and oriented to person, place, and time. She has normal reflexes. No cranial nerve  deficit. She exhibits normal muscle tone. Coordination normal.  Skin: Skin is warm and dry. No rash noted. No erythema.  Psychiatric: She has a normal mood and affect. Her behavior is normal. Judgment and thought content normal.          Assessment & Plan:  Well exam. We discussed diet and exercise. Get fasting labs.  [name], MD

## 2016-09-27 NOTE — Patient Instructions (Signed)
WE NOW OFFER   [facility] FAST TRACK!!!  SAME DAY Appointments for ACUTE CARE  Such as: Sprains, Injuries, cuts, abrasions, rashes, muscle pain, joint pain, back pain Colds, flu, sore throats, headache, allergies, cough, fever  Ear pain, sinus and eye infections Abdominal pain, nausea, vomiting, diarrhea, upset stomach Animal/insect bites  3 Easy Ways to Schedule: Walk-In Scheduling Call in scheduling Mychart Sign-up: [email or web address]         

## 2016-09-27 NOTE — Progress Notes (Signed)
Subjective:     Patient ID: Kristina Gillespie, female   DOB: 09-20-1965, 51 y.o.   MRN: [id number]  HPI   Review of Systems     Objective:   Physical Exam     Assessment:         Plan:

## 2016-10-11 ENCOUNTER — Encounter: Payer: Self-pay | Admitting: Family Medicine

## 2016-10-11 ENCOUNTER — Other Ambulatory Visit: Payer: Self-pay | Admitting: Family Medicine

## 2016-10-17 ENCOUNTER — Ambulatory Visit (AMBULATORY_SURGERY_CENTER): Payer: Self-pay | Admitting: *Deleted

## 2016-10-17 VITALS — Ht 62.0 in | Wt 193.8 lb

## 2016-10-17 DIAGNOSIS — Z1211 Encounter for screening for malignant neoplasm of colon: Secondary | ICD-10-CM

## 2016-10-17 MED ORDER — NA SULFATE-K SULFATE-MG SULF 17.5-3.13-1.6 GM/177ML PO SOLN: ORAL | 0 refills | Status: DC

## 2016-10-17 NOTE — Progress Notes (Signed)
No allergies to eggs or soy. No problems with anesthesia.  Pt given Emmi instructions for colonoscopy  No oxygen use  No diet drug use  

## 2016-10-18 ENCOUNTER — Encounter: Payer: Self-pay | Admitting: Gastroenterology

## 2016-10-29 ENCOUNTER — Encounter: Payer: Self-pay | Admitting: Gastroenterology

## 2016-10-29 ENCOUNTER — Ambulatory Visit (AMBULATORY_SURGERY_CENTER): Payer: Commercial Managed Care - PPO | Admitting: Gastroenterology

## 2016-10-29 VITALS — BP 123/81 | HR 75 | Temp 97.3°F | Resp 13 | Ht 62.0 in | Wt 193.0 lb

## 2016-10-29 DIAGNOSIS — Z1211 Encounter for screening for malignant neoplasm of colon: Secondary | ICD-10-CM | POA: Diagnosis present

## 2016-10-29 DIAGNOSIS — K635 Polyp of colon: Secondary | ICD-10-CM

## 2016-10-29 DIAGNOSIS — D125 Benign neoplasm of sigmoid colon: Secondary | ICD-10-CM | POA: Diagnosis not present

## 2016-10-29 DIAGNOSIS — I10 Essential (primary) hypertension: Secondary | ICD-10-CM | POA: Diagnosis not present

## 2016-10-29 DIAGNOSIS — D122 Benign neoplasm of ascending colon: Secondary | ICD-10-CM | POA: Diagnosis not present

## 2016-10-29 HISTORY — PX: COLONOSCOPY: SHX174

## 2016-10-29 MED ORDER — SODIUM CHLORIDE 0.9 % IV SOLN: 500.0000 mL | INTRAVENOUS | Status: DC

## 2016-10-29 NOTE — Op Note (Signed)
[facility] Patient Name: Kristina Gillespie Procedure Date: 10/29/2016 8:39 AM MRN: [id number] Endoscopist: [place]. Loletha Carrow , MD Age: 51 Referring MD:  Date of Birth: 1965-03-07 Gender: Female Account #: [id number] Procedure:                Colonoscopy Indications:              Screening for colorectal malignant neoplasm, This                            is the patient's first colonoscopy Medicines:                Monitored Anesthesia Care Procedure:                Pre-Anesthesia Assessment:                           - Prior to the procedure, a History and Physical                            was performed, and patient medications and                            allergies were reviewed. The patient's tolerance of                            previous anesthesia was also reviewed. The risks                            and benefits of the procedure and the sedation                            options and risks were discussed with the patient.                            All questions were answered, and informed consent                            was obtained. Prior Anticoagulants: The patient has                            taken no previous anticoagulant or antiplatelet                            agents. ASA Grade Assessment: II - A patient with                            mild systemic disease. After reviewing the risks                            and benefits, the patient was deemed in                            satisfactory condition to undergo the procedure.  After obtaining informed consent, the colonoscope                            was passed under direct vision. Throughout the                            procedure, the patient's blood pressure, pulse, and                            oxygen saturations were monitored continuously. The                            Model PCF-H190DL [id number]) scope was introduced                            through the anus and  advanced to the the terminal                            ileum. The colonoscopy was performed without                            difficulty. The patient tolerated the procedure                            well. The quality of the bowel preparation was                            excellent. The terminal ileum, ileocecal valve,                            appendiceal orifice, and rectum were photographed.                            The quality of the bowel preparation was evaluated                            using the BBPS [facility] Bowel Preparation Scale)                            with scores of: Right Colon = 3, Transverse Colon =                            3 and Left Colon = 3 (entire mucosa seen well with                            no residual staining, small fragments of stool or                            opaque liquid). The total BBPS score equals 9. The                            bowel preparation used was SUPREP. Scope In: 8:47:25 AM Scope Out: 9:01:57 AM Scope  Withdrawal Time: 0 hours 11 minutes 38 seconds  Total Procedure Duration: 0 hours 14 minutes 32 seconds  Findings:                 The perianal and digital rectal examinations were                            normal.                           The terminal ileum appeared normal.                           A 2 mm polyp was found in the ascending colon. The                            polyp was sessile. The polyp was removed with a                            cold snare. Resection and retrieval were complete.                           A 20 mm polyp was found in the proximal sigmoid                            colon. The polyp was pedunculated. The polyp was                            removed with a hot snare. Resection and retrieval                            were complete.                           The exam was otherwise without abnormality on                            direct and retroflexion views. Complications:            No  immediate complications. Estimated Blood Loss:     Estimated blood loss: none. Impression:               - The examined portion of the ileum was normal.                           - One 2 mm polyp in the ascending colon, removed                            with a cold snare. Resected and retrieved.                           - One 20 mm polyp in the proximal sigmoid colon,                            removed with a hot snare. Resected and retrieved.                           -  The examination was otherwise normal on direct                            and retroflexion views. Recommendation:           - Patient has a contact number available for                            emergencies. The signs and symptoms of potential                            delayed complications were discussed with the                            patient. Return to normal activities tomorrow.                            Written discharge instructions were provided to the                            patient.                           - Resume previous diet.                           - Continue present medications.                           - Await pathology results.                           - Repeat colonoscopy is recommended for                            surveillance. The colonoscopy date will be                            determined after pathology results from today's                            exam become available for review. Reed Eifert L. Loletha Carrow, MD 10/29/2016 9:07:35 AM This report has been signed electronically.

## 2016-10-29 NOTE — Patient Instructions (Signed)
Discharge instructions given. Handout on polyps. Resume previous medications. YOU HAD AN ENDOSCOPIC PROCEDURE TODAY AT THE [facility]:   Refer to the procedure report that was given to you for any specific questions about what was found during the examination.  If the procedure report does not answer your questions, please call your gastroenterologist to clarify.  If you requested that your care partner not be given the details of your procedure findings, then the procedure report has been included in a sealed envelope for you to review at your convenience later.  YOU SHOULD EXPECT: Some feelings of bloating in the abdomen. Passage of more gas than usual.  Walking can help get rid of the air that was put into your GI tract during the procedure and reduce the bloating. If you had a lower endoscopy (such as a colonoscopy or flexible sigmoidoscopy) you may notice spotting of blood in your stool or on the toilet paper. If you underwent a bowel prep for your procedure, you may not have a normal bowel movement for a few days.  Please Note:  You might notice some irritation and congestion in your nose or some drainage.  This is from the oxygen used during your procedure.  There is no need for concern and it should clear up in a day or so.  SYMPTOMS TO REPORT IMMEDIATELY:   Following lower endoscopy (colonoscopy or flexible sigmoidoscopy):  Excessive amounts of blood in the stool  Significant tenderness or worsening of abdominal pains  Swelling of the abdomen that is new, acute  Fever of 100F or higher   For urgent or emergent issues, a gastroenterologist can be reached at any hour by calling [phone].   DIET:  We do recommend a small meal at first, but then you may proceed to your regular diet.  Drink plenty of fluids but you should avoid alcoholic beverages for 24 hours.  ACTIVITY:  You should plan to take it easy for the rest of today and you should NOT DRIVE or use heavy  machinery until tomorrow (because of the sedation medicines used during the test).    FOLLOW UP: Our staff will call the number listed on your records the next business day following your procedure to check on you and address any questions or concerns that you may have regarding the information given to you following your procedure. If we do not reach you, we will leave a message.  However, if you are feeling well and you are not experiencing any problems, there is no need to return our call.  We will assume that you have returned to your regular daily activities without incident.  If any biopsies were taken you will be contacted by phone or by letter within the next 1-3 weeks.  Please call us at [phone] if you have not heard about the biopsies in 3 weeks.    SIGNATURES/CONFIDENTIALITY: You and/or your care partner have signed paperwork which will be entered into your electronic medical record.  These signatures attest to the fact that that the information above on your After Visit Summary has been reviewed and is understood.  Full responsibility of the confidentiality of this discharge information lies with you and/or your care-partner. 

## 2016-10-29 NOTE — Progress Notes (Signed)
Report given to PACU, vss 

## 2016-10-29 NOTE — Progress Notes (Signed)
Called to room to assist during endoscopic procedure.  Patient ID and intended procedure confirmed with present staff. Received instructions for my participation in the procedure from the performing physician.  

## 2016-10-30 ENCOUNTER — Telehealth: Payer: Self-pay | Admitting: *Deleted

## 2016-10-30 NOTE — Telephone Encounter (Signed)
  Follow up Call-  Call back number 10/29/2016  Post procedure Call Back phone  # [phone]  Permission to leave phone message Yes  Some recent data might be hidden     Patient questions:  Do you have a fever, pain , or abdominal swelling? No. Pain Score  0 *  Have you tolerated food without any problems? Yes.    Have you been able to return to your normal activities? Yes.    Do you have any questions about your discharge instructions: Diet   No. Medications  No. Follow up visit  No.  Do you have questions or concerns about your Care? No.  Actions: * If pain score is 4 or above: No action needed, pain <4.

## 2016-11-01 ENCOUNTER — Encounter: Payer: Self-pay | Admitting: Gastroenterology

## 2016-11-10 ENCOUNTER — Other Ambulatory Visit: Payer: Self-pay | Admitting: Family Medicine

## 2016-11-29 DIAGNOSIS — Z01419 Encounter for gynecological examination (general) (routine) without abnormal findings: Secondary | ICD-10-CM | POA: Diagnosis not present

## 2016-11-29 DIAGNOSIS — Z1231 Encounter for screening mammogram for malignant neoplasm of breast: Secondary | ICD-10-CM | POA: Diagnosis not present

## 2017-09-10 DIAGNOSIS — H6123 Impacted cerumen, bilateral: Secondary | ICD-10-CM | POA: Diagnosis not present

## 2017-09-30 ENCOUNTER — Ambulatory Visit (INDEPENDENT_AMBULATORY_CARE_PROVIDER_SITE_OTHER): Payer: Commercial Managed Care - PPO | Admitting: Family Medicine

## 2017-09-30 ENCOUNTER — Encounter: Payer: Self-pay | Admitting: Family Medicine

## 2017-09-30 VITALS — BP 116/70 | HR 73 | Temp 97.8°F | Ht 62.0 in | Wt 193.2 lb

## 2017-09-30 DIAGNOSIS — Z0000 Encounter for general adult medical examination without abnormal findings: Secondary | ICD-10-CM

## 2017-09-30 DIAGNOSIS — Z23 Encounter for immunization: Secondary | ICD-10-CM | POA: Diagnosis not present

## 2017-09-30 LAB — CBC WITH DIFFERENTIAL/PLATELET
BASOS ABS: 0 10*3/uL (ref 0.0–0.1)
Basophils Relative: 0.5 % (ref 0.0–3.0)
Eosinophils Absolute: 0.1 10*3/uL (ref 0.0–0.7)
Eosinophils Relative: 1.2 % (ref 0.0–5.0)
HCT: 42.2 % (ref 36.0–46.0)
HEMOGLOBIN: 14.3 g/dL (ref 12.0–15.0)
LYMPHS ABS: 3.2 10*3/uL (ref 0.7–4.0)
Lymphocytes Relative: 39.4 % (ref 12.0–46.0)
MCHC: 33.9 g/dL (ref 30.0–36.0)
MCV: 85.2 fl (ref 78.0–100.0)
MONO ABS: 0.3 10*3/uL (ref 0.1–1.0)
MONOS PCT: 3.3 % (ref 3.0–12.0)
NEUTROS PCT: 55.6 % (ref 43.0–77.0)
Neutro Abs: 4.6 10*3/uL (ref 1.4–7.7)
Platelets: 215 10*3/uL (ref 150.0–400.0)
RBC: 4.96 Mil/uL (ref 3.87–5.11)
RDW: 13.8 % (ref 11.5–15.5)
WBC: 8.2 10*3/uL (ref 4.0–10.5)

## 2017-09-30 LAB — BASIC METABOLIC PANEL
BUN: 11 mg/dL (ref 6–23)
CALCIUM: 9.5 mg/dL (ref 8.4–10.5)
CO2: 31 mEq/L (ref 19–32)
CREATININE: 0.65 mg/dL (ref 0.40–1.20)
Chloride: 103 mEq/L (ref 96–112)
GFR: 122.63 mL/min (ref >60.00)
Glucose, Bld: 84 mg/dL (ref 70–99)
Potassium: 3.2 mEq/L — ABNORMAL LOW (ref 3.5–5.1)
Sodium: 141 mEq/L (ref 135–145)

## 2017-09-30 LAB — LIPID PANEL
Cholesterol: 183 mg/dL (ref 0–200)
HDL: 60.2 mg/dL (ref >39.00)
LDL Cholesterol: 104 mg/dL — ABNORMAL HIGH (ref 0–99)
NONHDL: 122.97
TRIGLYCERIDES: 93 mg/dL (ref 0.0–149.0)
Total CHOL/HDL Ratio: 3
VLDL: 18.6 mg/dL (ref 0.0–40.0)

## 2017-09-30 LAB — POC URINALSYSI DIPSTICK (AUTOMATED)
Bilirubin, UA: NEGATIVE
Blood, UA: NEGATIVE
Glucose, UA: NEGATIVE
Ketones, UA: NEGATIVE
LEUKOCYTES UA: NEGATIVE
Nitrite, UA: NEGATIVE
PH UA: 7.5 (ref 5.0–8.0)
PROTEIN UA: NEGATIVE
SPEC GRAV UA: 1.015 (ref 1.010–1.025)
UROBILINOGEN UA: 0.2 U/dL

## 2017-09-30 LAB — TSH: TSH: 0.58 u[IU]/mL (ref 0.35–4.50)

## 2017-09-30 LAB — HEPATIC FUNCTION PANEL
ALT: 11 U/L (ref 0–35)
AST: 10 U/L (ref 0–37)
Albumin: 4.4 g/dL (ref 3.5–5.2)
Alkaline Phosphatase: 81 U/L (ref 39–117)
BILIRUBIN DIRECT: 0.1 mg/dL (ref 0.0–0.3)
BILIRUBIN TOTAL: 0.6 mg/dL (ref 0.2–1.2)
Total Protein: 7.1 g/dL (ref 6.0–8.3)

## 2017-09-30 MED ORDER — AMLODIPINE BESYLATE 10 MG PO TABS: 10.0000 mg | ORAL_TABLET | Freq: Every day | ORAL | 3 refills | Status: DC

## 2017-09-30 MED ORDER — FERROUS GLUCONATE 324 (38 FE) MG PO TABS: ORAL_TABLET | ORAL | 3 refills | Status: DC

## 2017-09-30 MED ORDER — TRIAMCINOLONE ACETONIDE 0.1 % EX CREA: 1.0000 "application " | TOPICAL_CREAM | Freq: Two times a day (BID) | CUTANEOUS | 3 refills | Status: DC

## 2017-09-30 NOTE — Progress Notes (Signed)
   Subjective:    Patient ID: Kristina Gillespie, female    DOB: 08-29-1965, 52 y.o.   MRN: [id number]  HPI Here for a well exam. She feels great.    Review of Systems  Constitutional: Negative.   HENT: Negative.   Eyes: Negative.   Respiratory: Negative.   Cardiovascular: Negative.   Gastrointestinal: Negative.   Genitourinary: Negative for decreased urine volume, difficulty urinating, dyspareunia, dysuria, enuresis, flank pain, frequency, hematuria, pelvic pain and urgency.  Musculoskeletal: Negative.   Skin: Negative.   Neurological: Negative.   Psychiatric/Behavioral: Negative.        Objective:   Physical Exam  Constitutional: She is oriented to person, place, and time. She appears well-developed and well-nourished. No distress.  HENT:  Head: Normocephalic and atraumatic.  Right Ear: External ear normal.  Left Ear: External ear normal.  Nose: Nose normal.  Mouth/Throat: Oropharynx is clear and moist. No oropharyngeal exudate.  Eyes: Pupils are equal, round, and reactive to light. Conjunctivae and EOM are normal. No scleral icterus.  Neck: Normal range of motion. Neck supple. No JVD present. No thyromegaly present.  Cardiovascular: Normal rate, regular rhythm, normal heart sounds and intact distal pulses. Exam reveals no gallop and no friction rub.  No murmur heard. Pulmonary/Chest: Effort normal and breath sounds normal. No respiratory distress. She has no wheezes. She has no rales. She exhibits no tenderness.  Abdominal: Soft. Bowel sounds are normal. She exhibits no distension and no mass. There is no tenderness. There is no rebound and no guarding.  Musculoskeletal: Normal range of motion. She exhibits no edema or tenderness.  Lymphadenopathy:    She has no cervical adenopathy.  Neurological: She is alert and oriented to person, place, and time. She has normal reflexes. She displays normal reflexes. No cranial nerve deficit. She exhibits normal muscle tone. Coordination  normal.  Skin: Skin is warm and dry. No rash noted. No erythema.  Psychiatric: She has a normal mood and affect. Her behavior is normal. Judgment and thought content normal.          Assessment & Plan:  Well exam. We discussed diet and exercise. Get fasting labs.  [name], MD

## 2017-10-02 ENCOUNTER — Encounter: Payer: Self-pay | Admitting: *Deleted

## 2017-12-04 DIAGNOSIS — Z01419 Encounter for gynecological examination (general) (routine) without abnormal findings: Secondary | ICD-10-CM | POA: Diagnosis not present

## 2017-12-04 DIAGNOSIS — Z6835 Body mass index (BMI) 35.0-35.9, adult: Secondary | ICD-10-CM | POA: Diagnosis not present

## 2017-12-04 DIAGNOSIS — Z1231 Encounter for screening mammogram for malignant neoplasm of breast: Secondary | ICD-10-CM | POA: Diagnosis not present

## 2018-05-20 ENCOUNTER — Telehealth: Payer: Self-pay | Admitting: Family Medicine

## 2018-05-20 NOTE — Telephone Encounter (Signed)
Pt. called COVID 19 question line to inquire about being tested for Coronavirus.  Reported she went to her cousin's home on 04/23/18, and has since found out her cousin's coworker tested positive for COVID 19, on 05/16/18.    The pt. stated she had bodyaches and Pinkeye x 48 hours, about 2 weeks ago.  Denied fever, cough, shortness of breath, chest tightness, or loss of sense of taste/smell, or diarrhea, at that time.  Reported she had headache on Sunday and Monday of this week.  Denied headache, or any other symptoms at present time.  Planning to go to the [place] on Friday this week.  Is asking if Dr. Sarajane Jews recommends she be tested for [place].    Advised pt. that her indirect exposure was more than a 2 week window of incubation period, in which coworker was diagnosed with COVID 19.  Advised will send this message to Dr. Sarajane Jews for review and recommendation.   Agreed with plan.

## 2018-05-20 NOTE — Telephone Encounter (Signed)
Pt needs OV to discuss.

## 2018-05-21 ENCOUNTER — Ambulatory Visit (INDEPENDENT_AMBULATORY_CARE_PROVIDER_SITE_OTHER): Payer: Commercial Managed Care - PPO | Admitting: Family Medicine

## 2018-05-21 ENCOUNTER — Other Ambulatory Visit: Payer: Self-pay

## 2018-05-21 ENCOUNTER — Encounter: Payer: Self-pay | Admitting: Family Medicine

## 2018-05-21 DIAGNOSIS — R6889 Other general symptoms and signs: Secondary | ICD-10-CM

## 2018-05-21 DIAGNOSIS — L309 Dermatitis, unspecified: Secondary | ICD-10-CM | POA: Diagnosis not present

## 2018-05-21 MED ORDER — BETAMETHASONE VALERATE 0.1 % EX OINT: 1.0000 "application " | TOPICAL_OINTMENT | Freq: Two times a day (BID) | CUTANEOUS | 2 refills | Status: DC

## 2018-05-21 NOTE — Progress Notes (Signed)
Subjective:    Patient ID: Kristina Gillespie, female    DOB: 1965-05-08, 53 y.o.   MRN: [id number]  HPI Virtual Visit via Video Note  I connected with the patient on 05/21/18 at  9:15 AM EDT by a video enabled telemedicine application and verified that I am speaking with the correct person using two identifiers.  Location patient: home Location provider:work or home office Persons participating in the virtual visit: patient, provider  I discussed the limitations of evaluation and management by telemedicine and the availability of in person appointments. The patient expressed understanding and agreed to proceed.   HPI: Here for 2 issues. First she is worried about exposure to the Covid-19 virus. Last weekend she spent time visiting her cousin here in [place], and yesterday she found out that a coworker of this cousin has tested positive for the virus. Kristina Gillespie also notes that for a few days lasy week she had body aches and itchy eyes, but that now she feels fine. Another issue is her eczema. She has itchy rashes on her arms and trunk, and the Triamcinolone cream she usually uses for this is not helping.    ROS: See pertinent positives and negatives per HPI.  Past Medical History:  Diagnosis Date   Anemia    Eczema    Heart murmur    Hyperlipidemia    Hypertension    Routine gynecological examination    sees Dr. 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 Loletha Carrow, adenomatous polyps with high grade dyspasia, repeat in 3 yrs    TONSILLECTOMY  2011   TUBAL LIGATION  1997    Family History  Problem Relation Age of Onset   Colon cancer Neg Hx      Current Outpatient Medications:    amLODipine (NORVASC) 10 MG tablet, Take 1 tablet (10 mg total) by mouth daily., Disp: 90 tablet, Rfl: 3   ferrous gluconate (FERGON) 324 MG tablet, TAKE 1 TABLET EVERY DAY WITH BREAKFAST,  Disp: 90 tablet, Rfl: 3   triamcinolone cream (KENALOG) 0.1 %, Apply 1 application topically 2 (two) times daily., Disp: 45 g, Rfl: 3   betamethasone valerate ointment (VALISONE) 0.1 %, Apply 1 application topically 2 (two) times daily., Disp: 30 g, Rfl: 2  Current Facility-Administered Medications:    0.9 %  sodium chloride infusion, 500 mL, Intravenous, Continuous, Danis, Estill Cotta III, MD  EXAM: SKIN: she has patches of scaly, red macular rash on the abdomen and left forearm.  VITALS per patient if applicable:  GENERAL: alert, oriented, appears well and in no acute distress  HEENT: atraumatic, conjunttiva clear, no obvious abnormalities on inspection of external nose and ears  NECK: normal movements of the head and neck  LUNGS: on inspection no signs of respiratory distress, breathing rate appears normal, no obvious gross SOB, gasping or wheezing  CV: no obvious cyanosis  MS: moves all visible extremities without noticeable abnormality  PSYCH/NEURO: pleasant and cooperative, no obvious depression or anxiety, speech and thought processing grossly intact  ASSESSMENT AND PLAN: Because of possible exposure to the Covid-19 virus, she will come in today for IgG testing. For the eczema she will try Betamethasone valerate cream.  [name], MD  Discussed the following assessment and plan:  Flu-like symptoms - Plan: SAR CoV2 Serology (COVID 19)AB(IGG)IA     I discussed the assessment and  treatment plan with the patient. The patient was provided an opportunity to ask questions and all were answered. The patient agreed with the plan and demonstrated an understanding of the instructions.   The patient was advised to call back or seek an in-person evaluation if the symptoms worsen or if the condition fails to improve as anticipated.     Review of Systems     Objective:   Physical Exam        Assessment & Plan:

## 2018-05-21 NOTE — Telephone Encounter (Signed)
lmom for pt to call back to schedule appointment.

## 2018-05-22 ENCOUNTER — Encounter: Payer: Self-pay | Admitting: *Deleted

## 2018-05-22 LAB — SAR COV2 SEROLOGY (COVID19)AB(IGG),IA: SARS CoV2 AB IGG: NEGATIVE

## 2018-08-11 ENCOUNTER — Ambulatory Visit (INDEPENDENT_AMBULATORY_CARE_PROVIDER_SITE_OTHER): Payer: Commercial Managed Care - PPO | Admitting: Family Medicine

## 2018-08-11 ENCOUNTER — Other Ambulatory Visit: Payer: Self-pay

## 2018-08-11 ENCOUNTER — Encounter: Payer: Self-pay | Admitting: Family Medicine

## 2018-08-11 DIAGNOSIS — R233 Spontaneous ecchymoses: Secondary | ICD-10-CM | POA: Diagnosis not present

## 2018-08-11 NOTE — Progress Notes (Signed)
Subjective:    Patient ID: Kristina Gillespie, female    DOB: 13-Apr-1965, 53 y.o.   MRN: [id number]  HPI Here for a bruise on the left upper arm that she noticed yesterday. This is not tender or painful. No recent trauma. She has no idea where this came from. She is alarmed because she has never had a bruise before. No trouble with easy bleeding, no nosebleeds, etc. She does not take aspirin.  Virtual Visit via Video Note  I connected with the patient on 08/11/18 at 11:30 AM EDT by a video enabled telemedicine application and verified that I am speaking with the correct person using two identifiers.  Location patient: home Location provider:work or home office Persons participating in the virtual visit: patient, provider  I discussed the limitations of evaluation and management by telemedicine and the availability of in person appointments. The patient expressed understanding and agreed to proceed.   HPI:    ROS: See pertinent positives and negatives per HPI.  Past Medical History:  Diagnosis Date  . Anemia   . Eczema   . Heart murmur   . Hyperlipidemia   . Hypertension   . Routine gynecological examination    sees Dr. Radene Knee   . 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 COLONOSCOPY  10/29/2016   per Dr. Loletha Carrow, adenomatous polyps with high grade dyspasia, repeat in 3 yrs   . TONSILLECTOMY  2011  . TUBAL LIGATION  1997    Family History  Problem Relation Age of Onset  . Colon cancer Neg Hx      Current Outpatient Medications:  .  amLODipine (NORVASC) 10 MG tablet, Take 1 tablet (10 mg total) by mouth daily., Disp: 90 tablet, Rfl: 3 .  betamethasone valerate ointment (VALISONE) 0.1 %, Apply 1 application topically 2 (two) times daily., Disp: 30 g, Rfl: 2 .  ferrous gluconate (FERGON) 324 MG tablet, TAKE 1 TABLET EVERY DAY WITH BREAKFAST, Disp: 90 tablet, Rfl: 3 .  triamcinolone cream (KENALOG) 0.1 %, Apply 1  application topically 2 (two) times daily., Disp: 45 g, Rfl: 3  Current Facility-Administered Medications:  .  0.9 %  sodium chloride infusion, 500 mL, Intravenous, Continuous, Danis, Estill Cotta III, MD  EXAM: SKIN: the lateral left upper arm has an ecchymotic area about 2 cm in diameter. No apparent swelling.  VITALS per patient if applicable:  GENERAL: alert, oriented, appears well and in no acute distress  HEENT: atraumatic, conjunttiva clear, no obvious abnormalities on inspection of external nose and ears  NECK: normal movements of the head and neck  LUNGS: on inspection no signs of respiratory distress, breathing rate appears normal, no obvious gross SOB, gasping or wheezing  CV: no obvious cyanosis  MS: moves all visible extremities without noticeable abnormality  PSYCH/NEURO: pleasant and cooperative, no obvious depression or anxiety, speech and thought processing grossly intact  ASSESSMENT AND PLAN: Unexplained bruise. She will come by for labs tomorrow to look for any clotting issues. Recheck if she sees any more bruises appear. [name], MD  Discussed the following assessment and plan:  No diagnosis found.     I discussed the assessment and treatment plan with the patient. The patient was provided an opportunity to ask questions and all were answered. The patient agreed with the plan and demonstrated an understanding of the instructions.   The patient was advised to call back or seek an  in-person evaluation if the symptoms worsen or if the condition fails to improve as anticipated.    Review of Systems     Objective:   Physical Exam        Assessment & Plan:

## 2018-08-12 ENCOUNTER — Other Ambulatory Visit: Payer: Self-pay

## 2018-08-12 ENCOUNTER — Other Ambulatory Visit (INDEPENDENT_AMBULATORY_CARE_PROVIDER_SITE_OTHER): Payer: Commercial Managed Care - PPO

## 2018-08-12 DIAGNOSIS — R233 Spontaneous ecchymoses: Secondary | ICD-10-CM

## 2018-08-12 LAB — CBC WITH DIFFERENTIAL/PLATELET
Basophils Absolute: 0 10*3/uL (ref 0.0–0.1)
Basophils Relative: 0.5 % (ref 0.0–3.0)
Eosinophils Absolute: 0.1 10*3/uL (ref 0.0–0.7)
Eosinophils Relative: 1.6 % (ref 0.0–5.0)
HCT: 41.6 % (ref 36.0–46.0)
Hemoglobin: 13.9 g/dL (ref 12.0–15.0)
Lymphocytes Relative: 39.1 % (ref 12.0–46.0)
Lymphs Abs: 3.3 10*3/uL (ref 0.7–4.0)
MCHC: 33.5 g/dL (ref 30.0–36.0)
MCV: 87.5 fl (ref 78.0–100.0)
Monocytes Absolute: 0.4 10*3/uL (ref 0.1–1.0)
Monocytes Relative: 4.2 % (ref 3.0–12.0)
Neutro Abs: 4.7 10*3/uL (ref 1.4–7.7)
Neutrophils Relative %: 54.6 % (ref 43.0–77.0)
Platelets: 211 10*3/uL (ref 150.0–400.0)
RBC: 4.75 Mil/uL (ref 3.87–5.11)
RDW: 13.7 % (ref 11.5–15.5)
WBC: 8.5 10*3/uL (ref 4.0–10.5)

## 2018-08-12 LAB — HEPATIC FUNCTION PANEL
ALT: 16 U/L (ref 0–35)
AST: 15 U/L (ref 0–37)
Albumin: 4.4 g/dL (ref 3.5–5.2)
Alkaline Phosphatase: 80 U/L (ref 39–117)
Bilirubin, Direct: 0.1 mg/dL (ref 0.0–0.3)
Total Bilirubin: 0.4 mg/dL (ref 0.2–1.2)
Total Protein: 6.9 g/dL (ref 6.0–8.3)

## 2018-08-12 LAB — PROTIME-INR
INR: 1 ratio (ref 0.8–1.0)
Prothrombin Time: 12.1 s (ref 9.6–13.1)

## 2018-08-12 LAB — BASIC METABOLIC PANEL
BUN: 7 mg/dL (ref 6–23)
CO2: 28 mEq/L (ref 19–32)
Calcium: 9.2 mg/dL (ref 8.4–10.5)
Chloride: 103 mEq/L (ref 96–112)
Creatinine, Ser: 0.75 mg/dL (ref 0.40–1.20)
GFR: 97.49 mL/min (ref >60.00)
Glucose, Bld: 79 mg/dL (ref 70–99)
Potassium: 3.2 mEq/L — ABNORMAL LOW (ref 3.5–5.1)
Sodium: 141 mEq/L (ref 135–145)

## 2018-08-12 LAB — APTT: aPTT: 28.9 s (ref 23.4–32.7)

## 2018-08-12 LAB — TSH: TSH: 0.63 u[IU]/mL (ref 0.35–4.50)

## 2018-08-18 ENCOUNTER — Encounter: Payer: Self-pay | Admitting: Family Medicine

## 2018-08-18 NOTE — Telephone Encounter (Signed)
Pease advise. These have not bee resulted yet.

## 2018-08-19 NOTE — Telephone Encounter (Signed)
Tell patient that her   Results were all normal   Including  cbc and clotting times except  Low potassium at 3,2 tthat was noted before   And that you are not on a medication that causes low potassium  .  I suggest increase potassium in diet and disc this with Dr Sarajane Jews

## 2018-08-20 NOTE — Telephone Encounter (Signed)
See my Result Note  

## 2018-08-21 NOTE — Telephone Encounter (Signed)
Results have been released to my chart. 

## 2018-10-02 ENCOUNTER — Ambulatory Visit (INDEPENDENT_AMBULATORY_CARE_PROVIDER_SITE_OTHER): Payer: Commercial Managed Care - PPO | Admitting: Family Medicine

## 2018-10-02 ENCOUNTER — Encounter: Payer: Self-pay | Admitting: Family Medicine

## 2018-10-02 ENCOUNTER — Other Ambulatory Visit: Payer: Self-pay

## 2018-10-02 VITALS — BP 120/70 | HR 80 | Temp 97.9°F | Wt 201.2 lb

## 2018-10-02 DIAGNOSIS — Z0000 Encounter for general adult medical examination without abnormal findings: Secondary | ICD-10-CM | POA: Diagnosis not present

## 2018-10-02 DIAGNOSIS — R21 Rash and other nonspecific skin eruption: Secondary | ICD-10-CM | POA: Diagnosis not present

## 2018-10-02 DIAGNOSIS — Z23 Encounter for immunization: Secondary | ICD-10-CM

## 2018-10-02 LAB — HEPATIC FUNCTION PANEL
ALT: 16 U/L (ref 0–35)
AST: 14 U/L (ref 0–37)
Albumin: 4.2 g/dL (ref 3.5–5.2)
Alkaline Phosphatase: 78 U/L (ref 39–117)
Bilirubin, Direct: 0.1 mg/dL (ref 0.0–0.3)
Total Bilirubin: 0.6 mg/dL (ref 0.2–1.2)
Total Protein: 7.1 g/dL (ref 6.0–8.3)

## 2018-10-02 LAB — LIPID PANEL
Cholesterol: 199 mg/dL (ref 0–200)
HDL: 56.9 mg/dL (ref >39.00)
LDL Cholesterol: 123 mg/dL — ABNORMAL HIGH (ref 0–99)
NonHDL: 142.57
Total CHOL/HDL Ratio: 4
Triglycerides: 97 mg/dL (ref 0.0–149.0)
VLDL: 19.4 mg/dL (ref 0.0–40.0)

## 2018-10-02 LAB — POC URINALSYSI DIPSTICK (AUTOMATED)
Bilirubin, UA: NEGATIVE
Blood, UA: NEGATIVE
Glucose, UA: NEGATIVE
Ketones, UA: NEGATIVE
Leukocytes, UA: NEGATIVE
Nitrite, UA: NEGATIVE
Protein, UA: POSITIVE — AB
Spec Grav, UA: 1.015 (ref 1.010–1.025)
Urobilinogen, UA: 0.2 E.U./dL
pH, UA: 7 (ref 5.0–8.0)

## 2018-10-02 LAB — T4, FREE: Free T4: 0.87 ng/dL (ref 0.60–1.60)

## 2018-10-02 LAB — CBC WITH DIFFERENTIAL/PLATELET
Basophils Absolute: 0 10*3/uL (ref 0.0–0.1)
Basophils Relative: 0.4 % (ref 0.0–3.0)
Eosinophils Absolute: 0.2 10*3/uL (ref 0.0–0.7)
Eosinophils Relative: 2.1 % (ref 0.0–5.0)
HCT: 41.2 % (ref 36.0–46.0)
Hemoglobin: 14.1 g/dL (ref 12.0–15.0)
Lymphocytes Relative: 36.9 % (ref 12.0–46.0)
Lymphs Abs: 3.2 10*3/uL (ref 0.7–4.0)
MCHC: 34.2 g/dL (ref 30.0–36.0)
MCV: 86.1 fl (ref 78.0–100.0)
Monocytes Absolute: 0.3 10*3/uL (ref 0.1–1.0)
Monocytes Relative: 3.7 % (ref 3.0–12.0)
Neutro Abs: 4.9 10*3/uL (ref 1.4–7.7)
Neutrophils Relative %: 56.9 % (ref 43.0–77.0)
Platelets: 213 10*3/uL (ref 150.0–400.0)
RBC: 4.78 Mil/uL (ref 3.87–5.11)
RDW: 13.8 % (ref 11.5–15.5)
WBC: 8.6 10*3/uL (ref 4.0–10.5)

## 2018-10-02 LAB — BASIC METABOLIC PANEL
BUN: 12 mg/dL (ref 6–23)
CO2: 32 mEq/L (ref 19–32)
Calcium: 9.3 mg/dL (ref 8.4–10.5)
Chloride: 104 mEq/L (ref 96–112)
Creatinine, Ser: 0.59 mg/dL (ref 0.40–1.20)
GFR: 128.53 mL/min (ref >60.00)
Glucose, Bld: 90 mg/dL (ref 70–99)
Potassium: 3.2 mEq/L — ABNORMAL LOW (ref 3.5–5.1)
Sodium: 143 mEq/L (ref 135–145)

## 2018-10-02 LAB — T3, FREE: T3, Free: 3.6 pg/mL (ref 2.3–4.2)

## 2018-10-02 LAB — TSH: TSH: 0.9 u[IU]/mL (ref 0.35–4.50)

## 2018-10-02 LAB — VITAMIN B12: Vitamin B-12: 251 pg/mL (ref 211–911)

## 2018-10-02 MED ORDER — AMLODIPINE BESYLATE 10 MG PO TABS: 10.0000 mg | ORAL_TABLET | Freq: Every day | ORAL | 3 refills | Status: DC

## 2018-10-02 MED ORDER — FERROUS GLUCONATE 324 (38 FE) MG PO TABS: ORAL_TABLET | ORAL | 3 refills | Status: DC

## 2018-10-02 NOTE — Progress Notes (Signed)
Subjective:    Patient ID: Kristina Gillespie, female    DOB: [birth year], 53 y.o.   MRN: [id number]  HPI Here for a well exam. She has several issues to discuss. Her BP has been well controlled. She say she has been gaining weight with no changes in lifestyle and she is worried she has a thyroid problem. In reviewing her chart, she has only gained 4 lbs in the past year. No family hx of thyroid problems. She does not exercise at all. Also she has been dealing with an itchy rash on the left arm and abdomen for over a year. She has been using Betamethasone cream daily and this has not helped at all. Also she mentions a twitch in the 3rd finger of the right hand that she notices daily for the past year. No other shaking to report. Of note her sister has tremors in both hands.    Review of Systems  Constitutional: Positive for unexpected weight change.  HENT: Negative.   Eyes: Negative.   Respiratory: Negative.   Cardiovascular: Negative.   Gastrointestinal: Negative.   Genitourinary: Negative for decreased urine volume, difficulty urinating, dyspareunia, dysuria, enuresis, flank pain, frequency, hematuria, pelvic pain and urgency.  Musculoskeletal: Negative.   Skin: Positive for rash.  Neurological: Positive for tremors.  Psychiatric/Behavioral: Negative.        Objective:   Physical Exam Constitutional:      General: She is not in acute distress.    Appearance: She is well-developed. She is obese.  HENT:     Head: Normocephalic and atraumatic.     Right Ear: External ear normal.     Left Ear: External ear normal.     Nose: Nose normal.     Mouth/Throat:     Pharynx: No oropharyngeal exudate.  Eyes:     General: No scleral icterus.    Conjunctiva/sclera: Conjunctivae normal.     Pupils: Pupils are equal, round, and reactive to light.  Neck:     Musculoskeletal: Normal range of motion and neck supple.     Thyroid: No thyromegaly.     Vascular: No JVD.  Cardiovascular:     Rate  and Rhythm: Normal rate and regular rhythm.     Heart sounds: Normal heart sounds. No murmur. No friction rub. No gallop.   Pulmonary:     Effort: Pulmonary effort is normal. No respiratory distress.     Breath sounds: Normal breath sounds. No wheezing or rales.  Chest:     Chest wall: No tenderness.  Abdominal:     General: Bowel sounds are normal. There is no distension.     Palpations: Abdomen is soft. There is no mass.     Tenderness: There is no abdominal tenderness. There is no guarding or rebound.  Musculoskeletal: Normal range of motion.        General: No tenderness.  Lymphadenopathy:     Cervical: No cervical adenopathy.  Skin:    General: Skin is warm and dry.     Findings: No erythema or rash.  Neurological:     General: No focal deficit present.     Mental Status: She is alert and oriented to person, place, and time.     Cranial Nerves: No cranial nerve deficit.     Sensory: No sensory deficit.     Motor: No weakness or abnormal muscle tone.     Coordination: Coordination normal.     Gait: Gait normal.     Deep Tendon  Reflexes: Reflexes are normal and symmetric. Reflexes normal.     Comments: No abnormal movements are seen today   Psychiatric:        Behavior: Behavior normal.        Thought Content: Thought content normal.        Judgment: Judgment normal.           Assessment & Plan:  Well exam. We discussed diet and exercise. I think her weight gain is simply due to normal reduction in her metabolism as she ages. She needs to reduce the carbs in her diet and exercise at least 5 days a week. We will get fasting labs to include a full thyroid panel. 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 We will keep an eye on this. The rash seems to be a type of eczema but it is not responding to steroid treatment. Refer to Dermatology to evaluate.  [name], MD

## 2018-10-06 ENCOUNTER — Other Ambulatory Visit: Payer: Self-pay | Admitting: Family Medicine

## 2018-10-06 ENCOUNTER — Encounter: Payer: Self-pay | Admitting: Family Medicine

## 2018-10-07 NOTE — Telephone Encounter (Signed)
Please advise 

## 2018-10-07 NOTE — Telephone Encounter (Signed)
Tell her not to worry about that. Her kidneys are working perfectly well. We are seeing a lot of these protein in the urine results and I think these are lab errors

## 2018-10-08 MED ORDER — POTASSIUM CHLORIDE ER 10 MEQ PO TBCR: 10.0000 meq | EXTENDED_RELEASE_TABLET | Freq: Every day | ORAL | 1 refills | Status: DC

## 2018-10-08 NOTE — Addendum Note (Signed)
Addended by: Gwenyth Ober R on: 10/08/2018 09:00 AM   Modules accepted: Orders

## 2018-11-17 ENCOUNTER — Other Ambulatory Visit: Payer: Self-pay

## 2018-11-17 DIAGNOSIS — Z20822 Contact with and (suspected) exposure to covid-19: Secondary | ICD-10-CM

## 2018-11-21 ENCOUNTER — Telehealth: Payer: Self-pay | Admitting: Family Medicine

## 2018-11-21 NOTE — Telephone Encounter (Signed)
Negative COVID results given. Patient results "NOT Detected." Caller expressed understanding. ° °

## 2018-11-23 LAB — NOVEL CORONAVIRUS, NAA: SARS-CoV-2, NAA: NOT DETECTED

## 2019-01-06 ENCOUNTER — Other Ambulatory Visit: Payer: Self-pay | Admitting: Family Medicine

## 2019-01-08 ENCOUNTER — Other Ambulatory Visit: Payer: Self-pay | Admitting: Family Medicine

## 2019-01-08 MED ORDER — TRIAMCINOLONE ACETONIDE 0.1 % EX CREA: 1.0000 "application " | TOPICAL_CREAM | Freq: Two times a day (BID) | CUTANEOUS | 1 refills | Status: DC

## 2019-01-08 MED ORDER — AMLODIPINE BESYLATE 10 MG PO TABS: 10.0000 mg | ORAL_TABLET | Freq: Every day | ORAL | 3 refills | Status: DC

## 2019-01-08 NOTE — Telephone Encounter (Signed)
Requested medication (s) are due for refill today: yes  Requested medication (s) are on the active medication list: yes  Last refill:  10/02/2018  Future visit scheduled: no  Notes to clinic:  pharmacy states that this needs to be resent as it didn't have a image when it came through   Requested Prescriptions  Pending Prescriptions Disp Refills   amLODipine (NORVASC) 10 MG tablet 90 tablet 3    Sig: Take 1 tablet (10 mg total) by mouth daily.      Cardiovascular:  Calcium Channel Blockers Passed - 01/08/2019 10:07 AM      Passed - Last BP in normal range    BP Readings from Last 1 Encounters:  10/02/18 120/70          Passed - Valid encounter within last 6 months    Recent Outpatient Visits           3 months ago Preventative health care   [facility] [place], Ishmael Holter, MD   5 months ago Bruising, spontaneous   [facility], Ishmael Holter, MD   7 months ago Flu-like symptoms   [facility], Ishmael Holter, MD   1 year ago Preventative health care   [facility] [place], Ishmael Holter, MD   2 years ago Preventative health care   [facility] [place], [place]               Signed Prescriptions Disp Refills   triamcinolone cream (KENALOG) 0.1 % 45 g 1    Sig: Apply 1 application topically 2 (two) times daily.      Dermatology:  Corticosteroids Passed - 01/08/2019 10:07 AM      Passed - Valid encounter within last 12 months    Recent Outpatient Visits           3 months ago Preventative health care   [place] [facility] [place], Ishmael Holter, MD   5 months ago Bruising, spontaneous   [facility] at [place], [place]   7 months ago Flu-like symptoms   [facility] at [facility], Ishmael Holter, MD   1 year ago Preventative health care   [facility] [place], Ishmael Holter, MD   2 years ago Preventative health care   [facility] [place], Ishmael Holter, MD

## 2019-01-08 NOTE — Telephone Encounter (Signed)
Copied from [place] [id number]. Topic: Quick Communication - Rx Refill/Question >> Jan 08, 2019  9:31 AM Rainey Pines A wrote: Medication: amLODipine (NORVASC) 10 MG tablet,ferrous gluconate (FERGON) 324 MG tablet ,potassium chloride (KLOR-CON 10) 10 MEQ tablet,triamcinolone cream (KENALOG) 0.1 %   Has the patient contacted their pharmacy? {Yes (Agent: If no, request that the patient contact the pharmacy for the refill.) (Agent: If yes, when and what did the pharmacy advise?)Contact PCP  Preferred Pharmacy (with phone number or street name): [id number] - [place], [place] - [address]  Phone:  [phone] Fax:  [phone]     Agent: Please be advised that RX refills may take up to 3 business days. We ask that you follow-up with your pharmacy.

## 2019-04-04 ENCOUNTER — Other Ambulatory Visit: Payer: Self-pay | Admitting: Family Medicine

## 2019-07-03 ENCOUNTER — Encounter: Payer: Self-pay | Admitting: Family Medicine

## 2019-07-03 NOTE — Telephone Encounter (Signed)
No this is nothing to worry about. 

## 2019-10-05 ENCOUNTER — Other Ambulatory Visit: Payer: Self-pay

## 2019-10-06 ENCOUNTER — Encounter: Payer: Self-pay | Admitting: Family Medicine

## 2019-10-06 ENCOUNTER — Ambulatory Visit (INDEPENDENT_AMBULATORY_CARE_PROVIDER_SITE_OTHER): Payer: Commercial Managed Care - PPO | Admitting: Family Medicine

## 2019-10-06 VITALS — BP 130/98 | HR 76 | Temp 98.3°F | Ht 62.5 in | Wt 204.2 lb

## 2019-10-06 DIAGNOSIS — Z86010 Personal history of colonic polyps: Secondary | ICD-10-CM

## 2019-10-06 DIAGNOSIS — Z0000 Encounter for general adult medical examination without abnormal findings: Secondary | ICD-10-CM

## 2019-10-06 MED ORDER — AMLODIPINE BESYLATE 10 MG PO TABS: 10.0000 mg | ORAL_TABLET | Freq: Every day | ORAL | 3 refills | Status: DC

## 2019-10-06 MED ORDER — POTASSIUM CHLORIDE ER 10 MEQ PO TBCR: 10.0000 meq | EXTENDED_RELEASE_TABLET | Freq: Every day | ORAL | 3 refills | Status: DC

## 2019-10-06 MED ORDER — BETAMETHASONE VALERATE 0.1 % EX OINT: 1.0000 "application " | TOPICAL_OINTMENT | Freq: Two times a day (BID) | CUTANEOUS | 5 refills | Status: AC

## 2019-10-06 NOTE — Progress Notes (Signed)
Subjective:    Patient ID: Kristina Gillespie, female    DOB: 1965/09/04, 54 y.o.   MRN: [id number]  HPI Here for a well exam. She has a few questions. We discussed a mld intermittent twitching she gets in the right 3rd finger a year ago, and this still occurs. It has not gotten any worse however. No other tremors or twitches. Also she has developed some mild pains in both hips when she walks or stands a lot. She does not treat this with anything.    Review of Systems  Constitutional: Negative.   HENT: Negative.   Eyes: Negative.   Respiratory: Negative.   Cardiovascular: Negative.   Gastrointestinal: Negative.   Genitourinary: Negative for decreased urine volume, difficulty urinating, dyspareunia, dysuria, enuresis, flank pain, frequency, hematuria, pelvic pain and urgency.  Musculoskeletal: Positive for arthralgias.  Skin: Negative.   Neurological: Positive for tremors.  Psychiatric/Behavioral: Negative.        Objective:   Physical Exam Constitutional:      General: She is not in acute distress.    Appearance: She is well-developed.  HENT:     Head: Normocephalic and atraumatic.     Right Ear: External ear normal.     Left Ear: External ear normal.     Nose: Nose normal.     Mouth/Throat:     Pharynx: No oropharyngeal exudate.  Eyes:     General: No scleral icterus.    Conjunctiva/sclera: Conjunctivae normal.     Pupils: Pupils are equal, round, and reactive to light.  Neck:     Thyroid: No thyromegaly.     Vascular: No JVD.  Cardiovascular:     Rate and Rhythm: Normal rate and regular rhythm.     Heart sounds: Normal heart sounds. No murmur heard.  No friction rub. No gallop.   Pulmonary:     Effort: Pulmonary effort is normal. No respiratory distress.     Breath sounds: Normal breath sounds. No wheezing or rales.  Chest:     Chest wall: No tenderness.  Abdominal:     General: Bowel sounds are normal. There is no distension.     Palpations: Abdomen is soft.  There is no mass.     Tenderness: There is no abdominal tenderness. There is no guarding or rebound.  Musculoskeletal:        General: No tenderness. Normal range of motion.     Cervical back: Normal range of motion and neck supple.  Lymphadenopathy:     Cervical: No cervical adenopathy.  Skin:    General: Skin is warm and dry.     Findings: No erythema or rash.  Neurological:     Mental Status: She is alert and oriented to person, place, and time.     Cranial Nerves: No cranial nerve deficit.     Motor: No abnormal muscle tone.     Coordination: Coordination normal.     Deep Tendon Reflexes: Reflexes are normal and symmetric. Reflexes normal.     Comments: No tremors in either hand are seen today   Psychiatric:        Behavior: Behavior normal.        Thought Content: Thought content normal.        Judgment: Judgment normal.           Assessment & Plan:  Well exam. We discussed diet and exercise. Get fasting labs. She seems to have a mild tremor in the right 3rd finger, but this has  not progressed over the past year. She will keep an eye on this and follow up as needed. 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 Losing some weight would certainly help with this. She is due for another colonoscopy next month.  [name], MD

## 2019-10-07 ENCOUNTER — Encounter: Payer: Self-pay | Admitting: Gastroenterology

## 2019-10-07 LAB — CBC WITH DIFFERENTIAL/PLATELET
Absolute Monocytes: 282 cells/uL (ref 200–950)
Basophils Absolute: 38 cells/uL (ref 0–200)
Basophils Relative: 0.4 %
Eosinophils Absolute: 132 cells/uL (ref 15–500)
Eosinophils Relative: 1.4 %
HCT: 42.3 % (ref 35.0–45.0)
Hemoglobin: 14.1 g/dL (ref 11.7–15.5)
Lymphs Abs: 3553 cells/uL (ref 850–3900)
MCH: 29.4 pg (ref 27.0–33.0)
MCHC: 33.3 g/dL (ref 32.0–36.0)
MCV: 88.3 fL (ref 80.0–100.0)
MPV: 11.8 fL (ref 7.5–12.5)
Monocytes Relative: 3 %
Neutro Abs: 5396 cells/uL (ref 1500–7800)
Neutrophils Relative %: 57.4 %
Platelets: 241 10*3/uL (ref 140–400)
RBC: 4.79 10*6/uL (ref 3.80–5.10)
RDW: 13.2 % (ref 11.0–15.0)
Total Lymphocyte: 37.8 %
WBC: 9.4 10*3/uL (ref 3.8–10.8)

## 2019-10-07 LAB — LIPID PANEL
Cholesterol: 212 mg/dL — ABNORMAL HIGH (ref <200)
HDL: 61 mg/dL (ref >=50)
LDL Cholesterol (Calc): 133 mg/dL (calc) — ABNORMAL HIGH
Non-HDL Cholesterol (Calc): 151 mg/dL (calc) — ABNORMAL HIGH (ref <130)
Total CHOL/HDL Ratio: 3.5 (calc) (ref <5.0)
Triglycerides: 85 mg/dL (ref <150)

## 2019-10-07 LAB — HEPATIC FUNCTION PANEL
AG Ratio: 1.7 (calc) (ref 1.0–2.5)
ALT: 12 U/L (ref 6–29)
AST: 12 U/L (ref 10–35)
Albumin: 4.4 g/dL (ref 3.6–5.1)
Alkaline phosphatase (APISO): 81 U/L (ref 37–153)
Bilirubin, Direct: 0.1 mg/dL (ref 0.0–0.2)
Globulin: 2.6 g/dL (calc) (ref 1.9–3.7)
Indirect Bilirubin: 0.5 mg/dL (calc) (ref 0.2–1.2)
Total Bilirubin: 0.6 mg/dL (ref 0.2–1.2)
Total Protein: 7 g/dL (ref 6.1–8.1)

## 2019-10-07 LAB — BASIC METABOLIC PANEL
BUN: 10 mg/dL (ref 7–25)
CO2: 30 mmol/L (ref 20–32)
Calcium: 9.2 mg/dL (ref 8.6–10.4)
Chloride: 104 mmol/L (ref 98–110)
Creat: 0.58 mg/dL (ref 0.50–1.05)
Glucose, Bld: 82 mg/dL (ref 65–99)
Potassium: 3.2 mmol/L — ABNORMAL LOW (ref 3.5–5.3)
Sodium: 142 mmol/L (ref 135–146)

## 2019-10-07 LAB — TSH: TSH: 0.69 mIU/L

## 2019-10-07 LAB — T3, FREE: T3, Free: 3.2 pg/mL (ref 2.3–4.2)

## 2019-10-07 LAB — T4, FREE: Free T4: 1.1 ng/dL (ref 0.8–1.8)

## 2019-10-08 MED ORDER — POTASSIUM CHLORIDE ER 10 MEQ PO TBCR: 10.0000 meq | EXTENDED_RELEASE_TABLET | Freq: Two times a day (BID) | ORAL | 3 refills | Status: DC

## 2019-10-08 NOTE — Addendum Note (Signed)
Addended by: Lahoma Crocker A on: 10/08/2019 11:10 AM   Modules accepted: Orders

## 2019-10-09 ENCOUNTER — Other Ambulatory Visit: Payer: Self-pay | Admitting: Family Medicine

## 2019-10-10 ENCOUNTER — Encounter: Payer: Self-pay | Admitting: Family Medicine

## 2019-10-12 ENCOUNTER — Encounter: Payer: Commercial Managed Care - PPO | Admitting: Family Medicine

## 2019-11-18 ENCOUNTER — Ambulatory Visit (AMBULATORY_SURGERY_CENTER): Payer: Self-pay

## 2019-11-18 ENCOUNTER — Other Ambulatory Visit: Payer: Self-pay

## 2019-11-18 VITALS — Ht 62.0 in | Wt 203.0 lb

## 2019-11-18 DIAGNOSIS — Z86010 Personal history of colonic polyps: Secondary | ICD-10-CM

## 2019-11-18 MED ORDER — PLENVU 140 G PO SOLR: 1.0000 | ORAL | 0 refills | Status: DC

## 2019-11-18 NOTE — Progress Notes (Signed)
No egg or soy allergy known to patient  No issues with past sedation with any surgeries or procedures No intubation problems in the past  No FH of Malignant Hyperthermia No diet pills per patient No home 02 use per patient  No blood thinners per patient  Pt denies issues with constipation  No A fib or A flutter  + for heart murmur per pt; COVID 19 guidelines implemented in [facility] today with Pt and RN  Coupon given to pt in [facility] today , Code to Pharmacy  COVID vaccines completed on 03/2019 per pt; Due to the COVID-19 pandemic we are asking patients to follow these guidelines. Please only bring one care partner. Please be aware that your care partner may wait in the car in the parking lot or if they feel like they will be too hot to wait in the car, they may wait in the lobby on the 4th floor. All care partners are required to wear a mask the entire time (we do not have any that we can provide them), they need to practice social distancing, and we will do a Covid check for all patient's and care partners when you arrive. Also we will check their temperature and your temperature. If the care partner waits in their car they need to stay in the parking lot the entire time and we will call them on their cell phone when the patient is ready for discharge so they can bring the car to the front of the building. Also all patient's will need to wear a mask into building.

## 2019-11-19 ENCOUNTER — Encounter: Payer: Self-pay | Admitting: Gastroenterology

## 2019-11-30 ENCOUNTER — Other Ambulatory Visit: Payer: Self-pay

## 2019-11-30 ENCOUNTER — Ambulatory Visit (AMBULATORY_SURGERY_CENTER): Payer: Commercial Managed Care - PPO | Admitting: Gastroenterology

## 2019-11-30 ENCOUNTER — Encounter: Payer: Self-pay | Admitting: Gastroenterology

## 2019-11-30 VITALS — BP 151/92 | HR 70 | Temp 96.6°F | Resp 19 | Ht 62.0 in | Wt 203.0 lb

## 2019-11-30 DIAGNOSIS — Z86010 Personal history of colonic polyps: Secondary | ICD-10-CM

## 2019-11-30 HISTORY — PX: COLONOSCOPY: SHX174

## 2019-11-30 MED ORDER — SODIUM CHLORIDE 0.9 % IV SOLN: 500.0000 mL | Freq: Once | INTRAVENOUS | Status: DC

## 2019-11-30 NOTE — Progress Notes (Signed)
To PACU, VSS. Report to [email or web address] 

## 2019-11-30 NOTE — Patient Instructions (Signed)
Please read handouts provided. Continue present medications. Repeat colonoscopy in 5 years for surveillance.     YOU HAD AN ENDOSCOPIC PROCEDURE TODAY AT [facility]:   Refer to the procedure report that was given to you for any specific questions about what was found during the examination.  If the procedure report does not answer your questions, please call your gastroenterologist to clarify.  If you requested that your care partner not be given the details of your procedure findings, then the procedure report has been included in a sealed envelope for you to review at your convenience later.  YOU SHOULD EXPECT: Some feelings of bloating in the abdomen. Passage of more gas than usual.  Walking can help get rid of the air that was put into your GI tract during the procedure and reduce the bloating. If you had a lower endoscopy (such as a colonoscopy or flexible sigmoidoscopy) you may notice spotting of blood in your stool or on the toilet paper. If you underwent a bowel prep for your procedure, you may not have a normal bowel movement for a few days.  Please Note:  You might notice some irritation and congestion in your nose or some drainage.  This is from the oxygen used during your procedure.  There is no need for concern and it should clear up in a day or so.  SYMPTOMS TO REPORT IMMEDIATELY:   Following lower endoscopy (colonoscopy or flexible sigmoidoscopy):  Excessive amounts of blood in the stool  Significant tenderness or worsening of abdominal pains  Swelling of the abdomen that is new, acute  Fever of 100F or higher   For urgent or emergent issues, a gastroenterologist can be reached at any hour by calling [phone]. Do not use MyChart messaging for urgent concerns.    DIET:  We do recommend a small meal at first, but then you may proceed to your regular diet.  Drink plenty of fluids but you should avoid alcoholic beverages for 24 hours.  ACTIVITY:  You  should plan to take it easy for the rest of today and you should NOT DRIVE or use heavy machinery until tomorrow (because of the sedation medicines used during the test).    FOLLOW UP: Our staff will call the number listed on your records 48-72 hours following your procedure to check on you and address any questions or concerns that you may have regarding the information given to you following your procedure. If we do not reach you, we will leave a message.  We will attempt to reach you two times.  During this call, we will ask if you have developed any symptoms of COVID 19. If you develop any symptoms (ie: fever, flu-like symptoms, shortness of breath, cough etc.) before then, please call [phone].  If you test positive for Covid 19 in the 2 weeks post procedure, please call and report this information to [place].    If any biopsies were taken you will be contacted by phone or by letter within the next 1-3 weeks.  Please call us at [phone] if you have not heard about the biopsies in 3 weeks.    SIGNATURES/CONFIDENTIALITY: You and/or your care partner have signed paperwork which will be entered into your electronic medical record.  These signatures attest to the fact that that the information above on your After Visit Summary has been reviewed and is understood.  Full responsibility of the confidentiality of this discharge information lies with you and/or your care-partner.

## 2019-11-30 NOTE — Progress Notes (Signed)
VS  By CW ° ° °Pt's states no medical or surgical changes since previsit or office visit. ° °

## 2019-11-30 NOTE — Op Note (Signed)
[facility] Patient Name: Kristina Gillespie Procedure Date: 11/30/2019 11:27 AM MRN: [id number] Endoscopist: [place]. Loletha Carrow , MD Age: 54 Referring MD:  Date of Birth: 08/27/1965 Gender: Female Account #: [id number] Procedure:                Colonoscopy Indications:              Surveillance: Personal history of adenomatous                            polyps on last colonoscopy 3 years ago (10/2016 -                            32mm A.C. TA and 34mm pedunculated left colon TVA                            with focal HGD) Medicines:                Monitored Anesthesia Care Procedure:                Pre-Anesthesia Assessment:                           - Prior to the procedure, a History and Physical                            was performed, and patient medications and                            allergies were reviewed. The patient's tolerance of                            previous anesthesia was also reviewed. The risks                            and benefits of the procedure and the sedation                            options and risks were discussed with the patient.                            All questions were answered, and informed consent                            was obtained. Prior Anticoagulants: The patient has                            taken no previous anticoagulant or antiplatelet                            agents. ASA Grade Assessment: II - A patient with                            mild systemic disease. After reviewing the risks  and benefits, the patient was deemed in                            satisfactory condition to undergo the procedure.                           After obtaining informed consent, the colonoscope                            was passed under direct vision. Throughout the                            procedure, the patient's blood pressure, pulse, and                            oxygen saturations were monitored continuously.  The                            Colonoscope was introduced through the anus and                            advanced to the the cecum, identified by                            appendiceal orifice and ileocecal valve. The                            colonoscopy was performed without difficulty. The                            patient tolerated the procedure well. The quality                            of the bowel preparation was excellent. The                            ileocecal valve, appendiceal orifice, and rectum                            were photographed. The bowel preparation used was                            Plenvu. Scope In: 11:36:25 AM Scope Out: 11:48:20 AM Scope Withdrawal Time: 0 hours 9 minutes 53 seconds  Total Procedure Duration: 0 hours 11 minutes 55 seconds  Findings:                 The perianal and digital rectal examinations were                            normal.                           The entire examined colon appeared normal on direct  and retroflexion views. Complications:            No immediate complications. Estimated Blood Loss:     Estimated blood loss: none. Impression:               - The entire examined colon is normal on direct and                            retroflexion views.                           - No specimens collected. Recommendation:           - Patient has a contact number available for                            emergencies. The signs and symptoms of potential                            delayed complications were discussed with the                            patient. Return to normal activities tomorrow.                            Written discharge instructions were provided to the                            patient.                           - Resume previous diet.                           - Continue present medications.                           - Repeat colonoscopy in 5 years for surveillance                             due to prior advanced adenoma. Milus Fritze L. Loletha Carrow, MD 11/30/2019 11:54:12 AM This report has been signed electronically.

## 2019-12-02 ENCOUNTER — Telehealth: Payer: Self-pay | Admitting: *Deleted

## 2019-12-02 NOTE — Telephone Encounter (Signed)
  Follow up Call-  Call back number 11/30/2019  Post procedure Call Back phone  # [phone]  Permission to leave phone message Yes  Some recent data might be hidden     Patient questions:  Do you have a fever, pain , or abdominal swelling? No. Pain Score  0 *  Have you tolerated food without any problems? Yes.    Have you been able to return to your normal activities? Yes.    Do you have any questions about your discharge instructions: Diet   No. Medications  No. Follow up visit  No.  Do you have questions or concerns about your Care? No.  Actions: * If pain score is 4 or above: No action needed, pain <4.  1. Have you developed a fever since your procedure? no  2.   Have you had an respiratory symptoms (SOB or cough) since your procedure? no  3.   Have you tested positive for COVID 19 since your procedure no  4.   Have you had any family members/close contacts diagnosed with the COVID 19 since your procedure?  no   If yes to any of these questions please route to Joylene John, RN and Joella Prince, RN

## 2019-12-02 NOTE — Telephone Encounter (Signed)
First f/u call attempt.  LVM

## 2020-03-11 ENCOUNTER — Encounter: Payer: Self-pay | Admitting: Family Medicine

## 2020-03-11 DIAGNOSIS — D649 Anemia, unspecified: Secondary | ICD-10-CM

## 2020-03-11 NOTE — Telephone Encounter (Signed)
She can stop the iron pill. We can check a Hgb the next time she comes in

## 2020-03-14 NOTE — Telephone Encounter (Signed)
Let's check it in 90 days

## 2020-04-05 ENCOUNTER — Other Ambulatory Visit: Payer: Self-pay

## 2020-04-05 ENCOUNTER — Telehealth: Payer: Self-pay | Admitting: Family Medicine

## 2020-04-05 NOTE — Telephone Encounter (Signed)
error 

## 2020-04-06 ENCOUNTER — Encounter: Payer: Self-pay | Admitting: Family Medicine

## 2020-04-06 ENCOUNTER — Ambulatory Visit (INDEPENDENT_AMBULATORY_CARE_PROVIDER_SITE_OTHER): Payer: Commercial Managed Care - PPO | Admitting: Family Medicine

## 2020-04-06 VITALS — BP 132/82 | HR 86 | Temp 98.0°F | Wt 202.0 lb

## 2020-04-06 DIAGNOSIS — L309 Dermatitis, unspecified: Secondary | ICD-10-CM | POA: Diagnosis not present

## 2020-04-06 MED ORDER — METHYLPREDNISOLONE 4 MG PO TBPK: ORAL_TABLET | ORAL | 0 refills | Status: DC

## 2020-04-06 NOTE — Progress Notes (Signed)
   Subjective:    Patient ID: Kristina Gillespie, female    DOB: 08/01/1965, 55 y.o.   MRN: [id number]  HPI Here for an itchy rash on the arms and legs that came up 2 weeks ago. She has eczema, and she usually gets relief by using Betamethasone cream, but this has not been working.    Review of Systems  Constitutional: Negative.   Respiratory: Negative.   Cardiovascular: Negative.   Skin: Positive for rash.       Objective:   Physical Exam Constitutional:      Appearance: Normal appearance.  Cardiovascular:     Rate and Rhythm: Normal rate and regular rhythm.     Pulses: Normal pulses.     Heart sounds: Normal heart sounds.  Pulmonary:     Effort: Pulmonary effort is normal.     Breath sounds: Normal breath sounds.  Skin:    Comments: There are scattered patches of red scaly skin as above   Neurological:     Mental Status: She is alert.           Assessment & Plan:  Eczema. She will take a Medrol dose pack and add Zyrtec daily as needed.  [name], MD

## 2020-05-27 NOTE — Telephone Encounter (Signed)
I have the patient scheduled for labs on 05/23 but I don't see any orders placed for Iron.  Can you place the orders before 05/23 please?

## 2020-05-27 NOTE — Telephone Encounter (Signed)
The labs were ordered  

## 2020-06-13 ENCOUNTER — Other Ambulatory Visit: Payer: Self-pay

## 2020-06-13 ENCOUNTER — Other Ambulatory Visit (INDEPENDENT_AMBULATORY_CARE_PROVIDER_SITE_OTHER): Payer: Commercial Managed Care - PPO

## 2020-06-13 DIAGNOSIS — D649 Anemia, unspecified: Secondary | ICD-10-CM

## 2020-06-13 LAB — CBC WITH DIFFERENTIAL/PLATELET
Basophils Absolute: 0.1 10*3/uL (ref 0.0–0.1)
Basophils Relative: 0.6 % (ref 0.0–3.0)
Eosinophils Absolute: 0.1 10*3/uL (ref 0.0–0.7)
Eosinophils Relative: 1.7 % (ref 0.0–5.0)
HCT: 41.8 % (ref 36.0–46.0)
Hemoglobin: 14.3 g/dL (ref 12.0–15.0)
Lymphocytes Relative: 40.1 % (ref 12.0–46.0)
Lymphs Abs: 3.5 10*3/uL (ref 0.7–4.0)
MCHC: 34.2 g/dL (ref 30.0–36.0)
MCV: 86.4 fl (ref 78.0–100.0)
Monocytes Absolute: 0.3 10*3/uL (ref 0.1–1.0)
Monocytes Relative: 4 % (ref 3.0–12.0)
Neutro Abs: 4.7 10*3/uL (ref 1.4–7.7)
Neutrophils Relative %: 53.6 % (ref 43.0–77.0)
Platelets: 212 10*3/uL (ref 150.0–400.0)
RBC: 4.84 Mil/uL (ref 3.87–5.11)
RDW: 13.7 % (ref 11.5–15.5)
WBC: 8.7 10*3/uL (ref 4.0–10.5)

## 2020-06-13 LAB — IBC + FERRITIN
Ferritin: 96.9 ng/mL (ref 10.0–291.0)
Iron: 92 ug/dL (ref 42–145)
Saturation Ratios: 29.6 % (ref 20.0–50.0)
Transferrin: 222 mg/dL (ref 212.0–360.0)

## 2020-09-28 ENCOUNTER — Other Ambulatory Visit: Payer: Self-pay | Admitting: Family Medicine

## 2020-10-17 ENCOUNTER — Other Ambulatory Visit: Payer: Self-pay

## 2020-10-18 ENCOUNTER — Ambulatory Visit (INDEPENDENT_AMBULATORY_CARE_PROVIDER_SITE_OTHER): Payer: Commercial Managed Care - PPO | Admitting: Family Medicine

## 2020-10-18 ENCOUNTER — Encounter: Payer: Self-pay | Admitting: Family Medicine

## 2020-10-18 VITALS — BP 110/80 | HR 74 | Temp 97.9°F | Ht 62.0 in | Wt 204.0 lb

## 2020-10-18 DIAGNOSIS — E876 Hypokalemia: Secondary | ICD-10-CM

## 2020-10-18 DIAGNOSIS — F419 Anxiety disorder, unspecified: Secondary | ICD-10-CM | POA: Insufficient documentation

## 2020-10-18 DIAGNOSIS — Z0000 Encounter for general adult medical examination without abnormal findings: Secondary | ICD-10-CM | POA: Diagnosis not present

## 2020-10-18 LAB — CBC WITH DIFFERENTIAL/PLATELET
Basophils Absolute: 0 10*3/uL (ref 0.0–0.1)
Basophils Relative: 0.4 % (ref 0.0–3.0)
Eosinophils Absolute: 0.1 10*3/uL (ref 0.0–0.7)
Eosinophils Relative: 1.6 % (ref 0.0–5.0)
HCT: 43.2 % (ref 36.0–46.0)
Hemoglobin: 14.5 g/dL (ref 12.0–15.0)
Lymphocytes Relative: 35.8 % (ref 12.0–46.0)
Lymphs Abs: 3.1 10*3/uL (ref 0.7–4.0)
MCHC: 33.5 g/dL (ref 30.0–36.0)
MCV: 86.6 fl (ref 78.0–100.0)
Monocytes Absolute: 0.3 10*3/uL (ref 0.1–1.0)
Monocytes Relative: 3.3 % (ref 3.0–12.0)
Neutro Abs: 5 10*3/uL (ref 1.4–7.7)
Neutrophils Relative %: 58.9 % (ref 43.0–77.0)
Platelets: 216 10*3/uL (ref 150.0–400.0)
RBC: 4.99 Mil/uL (ref 3.87–5.11)
RDW: 13.5 % (ref 11.5–15.5)
WBC: 8.6 10*3/uL (ref 4.0–10.5)

## 2020-10-18 LAB — HEPATIC FUNCTION PANEL
ALT: 13 U/L (ref 0–35)
AST: 13 U/L (ref 0–37)
Albumin: 4.3 g/dL (ref 3.5–5.2)
Alkaline Phosphatase: 78 U/L (ref 39–117)
Bilirubin, Direct: 0 mg/dL (ref 0.0–0.3)
Total Bilirubin: 0.5 mg/dL (ref 0.2–1.2)
Total Protein: 7.3 g/dL (ref 6.0–8.3)

## 2020-10-18 LAB — LIPID PANEL
Cholesterol: 200 mg/dL (ref 0–200)
HDL: 56.2 mg/dL (ref >39.00)
LDL Cholesterol: 127 mg/dL — ABNORMAL HIGH (ref 0–99)
NonHDL: 143.59
Total CHOL/HDL Ratio: 4
Triglycerides: 85 mg/dL (ref 0.0–149.0)
VLDL: 17 mg/dL (ref 0.0–40.0)

## 2020-10-18 LAB — HEMOGLOBIN A1C: Hgb A1c MFr Bld: 5.4 % (ref 4.6–6.5)

## 2020-10-18 LAB — BASIC METABOLIC PANEL
BUN: 9 mg/dL (ref 6–23)
CO2: 29 mEq/L (ref 19–32)
Calcium: 9.4 mg/dL (ref 8.4–10.5)
Chloride: 104 mEq/L (ref 96–112)
Creatinine, Ser: 0.59 mg/dL (ref 0.40–1.20)
GFR: 101.02 mL/min (ref >60.00)
Glucose, Bld: 89 mg/dL (ref 70–99)
Potassium: 3.1 mEq/L — ABNORMAL LOW (ref 3.5–5.1)
Sodium: 142 mEq/L (ref 135–145)

## 2020-10-18 LAB — TSH: TSH: 0.92 u[IU]/mL (ref 0.35–5.50)

## 2020-10-18 MED ORDER — POTASSIUM CHLORIDE ER 10 MEQ PO TBCR: 10.0000 meq | EXTENDED_RELEASE_TABLET | Freq: Two times a day (BID) | ORAL | 3 refills | Status: DC

## 2020-10-18 MED ORDER — TRIAMCINOLONE ACETONIDE 0.1 % EX CREA: 1.0000 "application " | TOPICAL_CREAM | Freq: Two times a day (BID) | CUTANEOUS | 5 refills | Status: DC

## 2020-10-18 MED ORDER — LORAZEPAM 0.5 MG PO TABS: 0.5000 mg | ORAL_TABLET | Freq: Four times a day (QID) | ORAL | 2 refills | Status: DC | PRN

## 2020-10-18 NOTE — Progress Notes (Signed)
Subjective:    Patient ID: Kristina Gillespie, female    DOB: [birth year], 55 y.o.   MRN: [id number]  HPI Here for a well exam. She feels fine except for anxiety. She tired Paxil some years ago but had to stop it due to sedation. She has dealt with this for years, but lately it seems to be more if an issue. She worries about things and she gets stressed very easily. In situations such as driving in traffic or being in the room when her husband and son get into arguments cause her to become very anxious and she may have trouble breathing. Her appetite and sleep are intact. She denies any depression symptoms. She asks to try a prn anxiety medication.   Review of Systems  Constitutional: Negative.   HENT: Negative.    Eyes: Negative.   Respiratory: Negative.    Cardiovascular: Negative.   Gastrointestinal: Negative.   Genitourinary:  Negative for decreased urine volume, difficulty urinating, dyspareunia, dysuria, enuresis, flank pain, frequency, hematuria, pelvic pain and urgency.  Musculoskeletal: Negative.   Skin: Negative.   Neurological: Negative.  Negative for headaches.  Psychiatric/Behavioral:  Negative for agitation, behavioral problems, confusion, decreased concentration, dysphoric mood, hallucinations and sleep disturbance. The patient is nervous/anxious.       Objective:   Physical Exam Constitutional:      General: She is not in acute distress.    Appearance: She is well-developed. She is obese.  HENT:     Head: Normocephalic and atraumatic.     Right Ear: External ear normal.     Left Ear: External ear normal.     Nose: Nose normal.     Mouth/Throat:     Pharynx: No oropharyngeal exudate.  Eyes:     General: No scleral icterus.    Conjunctiva/sclera: Conjunctivae normal.     Pupils: Pupils are equal, round, and reactive to light.  Neck:     Thyroid: No thyromegaly.     Vascular: No JVD.  Cardiovascular:     Rate and Rhythm: Normal rate and regular rhythm.     Heart  sounds: Normal heart sounds. No murmur heard.   No friction rub. No gallop.  Pulmonary:     Effort: Pulmonary effort is normal. No respiratory distress.     Breath sounds: Normal breath sounds. No wheezing or rales.  Chest:     Chest wall: No tenderness.  Abdominal:     General: Bowel sounds are normal. There is no distension.     Palpations: Abdomen is soft. There is no mass.     Tenderness: There is no abdominal tenderness. There is no guarding or rebound.  Musculoskeletal:        General: No tenderness. Normal range of motion.     Cervical back: Normal range of motion and neck supple.  Lymphadenopathy:     Cervical: No cervical adenopathy.  Skin:    General: Skin is warm and dry.     Findings: No erythema or rash.  Neurological:     Mental Status: She is alert and oriented to person, place, and time.     Cranial Nerves: No cranial nerve deficit.     Motor: No abnormal muscle tone.     Coordination: Coordination normal.     Deep Tendon Reflexes: Reflexes are normal and symmetric. Reflexes normal.  Psychiatric:        Mood and Affect: Mood normal.        Behavior: Behavior normal.  Thought Content: Thought content normal.        Judgment: Judgment normal.          Assessment & Plan:  Well exam. We discussed diet and exercise. Get fasting labs. For the anxiety she will try Lorazepam 0.5 mg as needed. [name], MD

## 2020-10-20 MED ORDER — POTASSIUM CHLORIDE ER 10 MEQ PO TBCR: EXTENDED_RELEASE_TABLET | ORAL | 3 refills | Status: DC

## 2020-10-20 NOTE — Addendum Note (Signed)
Addended by: Nilda Riggs on: 10/20/2020 12:40 PM   Modules accepted: Orders

## 2020-11-17 ENCOUNTER — Other Ambulatory Visit (INDEPENDENT_AMBULATORY_CARE_PROVIDER_SITE_OTHER): Payer: Commercial Managed Care - PPO

## 2020-11-17 ENCOUNTER — Other Ambulatory Visit: Payer: Self-pay

## 2020-11-17 DIAGNOSIS — E876 Hypokalemia: Secondary | ICD-10-CM

## 2020-11-17 LAB — BASIC METABOLIC PANEL
BUN: 8 mg/dL (ref 6–23)
CO2: 30 mEq/L (ref 19–32)
Calcium: 9.3 mg/dL (ref 8.4–10.5)
Chloride: 104 mEq/L (ref 96–112)
Creatinine, Ser: 0.62 mg/dL (ref 0.40–1.20)
GFR: 99.76 mL/min (ref >60.00)
Glucose, Bld: 125 mg/dL — ABNORMAL HIGH (ref 70–99)
Potassium: 3.4 mEq/L — ABNORMAL LOW (ref 3.5–5.1)
Sodium: 141 mEq/L (ref 135–145)

## 2021-10-09 ENCOUNTER — Other Ambulatory Visit: Payer: Self-pay | Admitting: Family Medicine

## 2021-10-24 ENCOUNTER — Encounter: Payer: Self-pay | Admitting: Family Medicine

## 2021-10-24 ENCOUNTER — Ambulatory Visit (INDEPENDENT_AMBULATORY_CARE_PROVIDER_SITE_OTHER): Payer: Commercial Managed Care - PPO | Admitting: Family Medicine

## 2021-10-24 VITALS — BP 120/82 | HR 80 | Temp 98.1°F | Ht 62.0 in | Wt 204.0 lb

## 2021-10-24 DIAGNOSIS — Z0000 Encounter for general adult medical examination without abnormal findings: Secondary | ICD-10-CM

## 2021-10-24 LAB — HEPATIC FUNCTION PANEL
ALT: 13 U/L (ref 0–35)
AST: 16 U/L (ref 0–37)
Albumin: 4.3 g/dL (ref 3.5–5.2)
Alkaline Phosphatase: 73 U/L (ref 39–117)
Bilirubin, Direct: 0.1 mg/dL (ref 0.0–0.3)
Total Bilirubin: 0.8 mg/dL (ref 0.2–1.2)
Total Protein: 7.4 g/dL (ref 6.0–8.3)

## 2021-10-24 LAB — BASIC METABOLIC PANEL
BUN: 9 mg/dL (ref 6–23)
CO2: 28 mEq/L (ref 19–32)
Calcium: 9.3 mg/dL (ref 8.4–10.5)
Chloride: 105 mEq/L (ref 96–112)
Creatinine, Ser: 0.62 mg/dL (ref 0.40–1.20)
GFR: 99.11 mL/min (ref >60.00)
Glucose, Bld: 95 mg/dL (ref 70–99)
Potassium: 2.9 mEq/L — ABNORMAL LOW (ref 3.5–5.1)
Sodium: 142 mEq/L (ref 135–145)

## 2021-10-24 LAB — HEMOGLOBIN A1C: Hgb A1c MFr Bld: 5.5 % (ref 4.6–6.5)

## 2021-10-24 LAB — LIPID PANEL
Cholesterol: 202 mg/dL — ABNORMAL HIGH (ref 0–200)
HDL: 58.6 mg/dL (ref >39.00)
LDL Cholesterol: 127 mg/dL — ABNORMAL HIGH (ref 0–99)
NonHDL: 142.99
Total CHOL/HDL Ratio: 3
Triglycerides: 79 mg/dL (ref 0.0–149.0)
VLDL: 15.8 mg/dL (ref 0.0–40.0)

## 2021-10-24 LAB — TSH: TSH: 0.89 u[IU]/mL (ref 0.35–5.50)

## 2021-10-24 LAB — CBC WITH DIFFERENTIAL/PLATELET
Basophils Absolute: 0 10*3/uL (ref 0.0–0.1)
Basophils Relative: 0.5 % (ref 0.0–3.0)
Eosinophils Absolute: 0.1 10*3/uL (ref 0.0–0.7)
Eosinophils Relative: 1.5 % (ref 0.0–5.0)
HCT: 42.3 % (ref 36.0–46.0)
Hemoglobin: 14.3 g/dL (ref 12.0–15.0)
Lymphocytes Relative: 38.4 % (ref 12.0–46.0)
Lymphs Abs: 3.7 10*3/uL (ref 0.7–4.0)
MCHC: 33.7 g/dL (ref 30.0–36.0)
MCV: 86.7 fl (ref 78.0–100.0)
Monocytes Absolute: 0.3 10*3/uL (ref 0.1–1.0)
Monocytes Relative: 3.6 % (ref 3.0–12.0)
Neutro Abs: 5.4 10*3/uL (ref 1.4–7.7)
Neutrophils Relative %: 56 % (ref 43.0–77.0)
Platelets: 221 10*3/uL (ref 150.0–400.0)
RBC: 4.88 Mil/uL (ref 3.87–5.11)
RDW: 13.7 % (ref 11.5–15.5)
WBC: 9.6 10*3/uL (ref 4.0–10.5)

## 2021-10-24 MED ORDER — AMLODIPINE BESYLATE 10 MG PO TABS: 10.0000 mg | ORAL_TABLET | Freq: Every day | ORAL | 3 refills | Status: DC

## 2021-10-24 MED ORDER — POTASSIUM CHLORIDE ER 10 MEQ PO TBCR: EXTENDED_RELEASE_TABLET | ORAL | 3 refills | Status: DC

## 2021-10-24 NOTE — Progress Notes (Signed)
Subjective:    Patient ID: Kristina Gillespie, female    DOB: 1965/05/26, 56 y.o.   MRN: [id number]  HPI Here for a well exam. She is doing well except she mentions a tendency to feel SOB when she is talking on the phone or when she is having dental work done. She says she will be speaking to someone and then have to stop and take a deep breath before she resumes speaking. Otherwise she never feels SOB even when exerting herself, going up steps, etc. No chest pain. She denies having any trouble breathing through her nose.    Review of Systems  Constitutional: Negative.   HENT: Negative.    Eyes: Negative.   Respiratory: Negative.    Cardiovascular: Negative.   Gastrointestinal: Negative.   Genitourinary:  Negative for decreased urine volume, difficulty urinating, dyspareunia, dysuria, enuresis, flank pain, frequency, hematuria, pelvic pain and urgency.  Musculoskeletal: Negative.   Skin: Negative.   Neurological: Negative.  Negative for headaches.  Psychiatric/Behavioral: Negative.         Objective:   Physical Exam Constitutional:      General: She is not in acute distress.    Appearance: She is well-developed. She is obese.  HENT:     Head: Normocephalic and atraumatic.     Right Ear: External ear normal.     Left Ear: External ear normal.     Nose:     Comments: Both nostrils have large turbinates and there is only a tiny opening for air to get through. The septum is not deviated     Mouth/Throat:     Pharynx: No oropharyngeal exudate.  Eyes:     General: No scleral icterus.    Conjunctiva/sclera: Conjunctivae normal.     Pupils: Pupils are equal, round, and reactive to light.  Neck:     Thyroid: No thyromegaly.     Vascular: No JVD.  Cardiovascular:     Rate and Rhythm: Normal rate and regular rhythm.     Heart sounds: Normal heart sounds. No murmur heard.    No friction rub. No gallop.  Pulmonary:     Effort: Pulmonary effort is normal. No respiratory distress.      Breath sounds: Normal breath sounds. No wheezing or rales.  Chest:     Chest wall: No tenderness.  Abdominal:     General: Bowel sounds are normal. There is no distension.     Palpations: Abdomen is soft. There is no mass.     Tenderness: There is no abdominal tenderness. There is no guarding or rebound.  Musculoskeletal:        General: No tenderness. Normal range of motion.     Cervical back: Normal range of motion and neck supple.  Lymphadenopathy:     Cervical: No cervical adenopathy.  Skin:    General: Skin is warm and dry.     Findings: No erythema or rash.  Neurological:     Mental Status: She is alert and oriented to person, place, and time.     Cranial Nerves: No cranial nerve deficit.     Motor: No abnormal muscle tone.     Coordination: Coordination normal.     Deep Tendon Reflexes: Reflexes are normal and symmetric. Reflexes normal.  Psychiatric:        Behavior: Behavior normal.        Thought Content: Thought content normal.        Judgment: Judgment normal.  Assessment & Plan:  Well exam. We discussed diet and exercise. Get fasting labs. I suspect she is a mouth breather given her descriptions above. I advised her try Flonase nasal sprays daily to see if this helps.  [name], MD

## 2021-10-25 NOTE — Telephone Encounter (Signed)
I am not sure exactly, but this level has been borderline low for years. I don't think it is anything to worry about

## 2022-01-05 ENCOUNTER — Ambulatory Visit: Payer: Commercial Managed Care - PPO | Admitting: Family Medicine

## 2022-01-17 ENCOUNTER — Telehealth: Payer: Self-pay | Admitting: Family Medicine

## 2022-01-17 ENCOUNTER — Other Ambulatory Visit: Payer: Self-pay

## 2022-01-17 MED ORDER — CIPROFLOXACIN HCL 500 MG PO TABS: 500.0000 mg | ORAL_TABLET | Freq: Two times a day (BID) | ORAL | 0 refills | Status: AC

## 2022-01-17 NOTE — Telephone Encounter (Signed)
Call in Cipro 500 mg BID for 7 days  

## 2022-01-17 NOTE — Telephone Encounter (Signed)
Spoke with patient, she stated that on 01/14/22 she purchased a Urinary Tract Infection testing kit from the pharmacy  and tested positive for leukocytes.   She purchased AZO from the pharmacy, and the symptoms that she was having has subsided.  Patient tested again on today and the test was positive again, patient thinks she needs antibiotic.   Please advise.

## 2022-01-17 NOTE — Telephone Encounter (Signed)
Called patient informed Cipro 500 mg was sent to [facility] on [address].

## 2022-01-17 NOTE — Telephone Encounter (Signed)
Patient requesting a prescription for something to help with a UTI, declined OV  [id number] [id number]: [phone] Fax: [phone]

## 2022-02-16 ENCOUNTER — Emergency Department (HOSPITAL_COMMUNITY)
Admission: EM | Admit: 2022-02-16 | Discharge: 2022-02-16 | Discharge status: Against Medical Advice | Payer: Commercial Managed Care - PPO | Attending: Emergency Medicine | Admitting: Emergency Medicine

## 2022-02-16 ENCOUNTER — Ambulatory Visit
Admission: RE | Admit: 2022-02-16 | Discharge: 2022-02-16 | Disposition: A | Discharge status: Home / Self Care | Payer: Commercial Managed Care - PPO | Source: Ambulatory Visit | Attending: Nurse Practitioner | Admitting: Nurse Practitioner

## 2022-02-16 VITALS — BP 133/85 | HR 83 | Temp 98.3°F | Resp 16

## 2022-02-16 DIAGNOSIS — M25559 Pain in unspecified hip: Secondary | ICD-10-CM | POA: Diagnosis not present

## 2022-02-16 DIAGNOSIS — R079 Chest pain, unspecified: Secondary | ICD-10-CM | POA: Insufficient documentation

## 2022-02-16 DIAGNOSIS — Z5321 Procedure and treatment not carried out due to patient leaving prior to being seen by health care provider: Secondary | ICD-10-CM | POA: Insufficient documentation

## 2022-02-16 DIAGNOSIS — M549 Dorsalgia, unspecified: Secondary | ICD-10-CM | POA: Diagnosis not present

## 2022-02-16 NOTE — ED Notes (Signed)
No answer from pt when called for triage x 2.

## 2022-02-16 NOTE — ED Provider Notes (Signed)
[facility]    CSN: [id number] Arrival date & time: 02/16/22  0913      History   Chief Complaint Chief Complaint  Patient presents with   Hip Pain    Hip pain, back pain, burping,  and chest pain - Entered by patient   Back Pain   Headache    HPI Kristina Gillespie is a 57 y.o. female presents for multiple complaints.  Patient reports today she had 2 episodes of a sharp midsternal chest pain that did not radiate.  It lasted a few minutes and then resolved on its own.  Since that time she has had some dizziness and also endorses some mid upper back pain that is "deep" and does not hurt to touch.  She is also had intermittent left-sided headaches.  A week ago she bent over in the shower to wash her feet and had pain on the entire left side of her body and since then has had numbness and tingling that is intermittent in her left hand.  Denies neck pain.  Does endorse left hip and low back pain.  Last night she said both of her arms felt heavy without any known cause.  She denies any shortness of breath, palpitations, syncope, diaphoresis.  She does have a history of hypertension and hyperlipidemia.  Denies history of diabetes.  Has a family history of stroke with her brother having a stroke at age 42.  She denies any previous history of cardiac workup including stress test or echocardiogram.  No other concerns at this time.   Hip Pain Associated symptoms include chest pain and headaches.  Back Pain Associated symptoms: chest pain and headaches   Headache Associated symptoms: back pain     Past Medical History:  Diagnosis Date   Anemia    on meds   Anxiety    hx of/at times   Eczema    GERD (gastroesophageal reflux disease)    OTC meds/with certain foods   Heart murmur    current   Hyperlipidemia    not currently on meds   Hypertension    on meds   Routine gynecological examination    sees Dr. 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 Loletha Carrow, no polyps, repeat in 5 yrs (prior scope had adenomatous polyps)   [place]    OB History   No obstetric history on file.      Home Medications    Prior to Admission medications   Medication Sig Start Date End Date Taking? Authorizing Provider  amLODipine (NORVASC) 10 MG tablet Take 1 tablet (10 mg total) by mouth daily. 10/24/21   Laurey Morale, MD  betamethasone valerate ointment (VALISONE) 0.1 % Apply 1 application topically 2 (two) times daily. Patient taking differently: Apply 1 application  topically 2 (two) times daily as needed. 10/06/19   Laurey Morale, MD  LORazepam (ATIVAN) 0.5 MG tablet Take 1 tablet (0.5 mg total) by mouth every 6 (six) hours as needed for anxiety. 10/18/20   Laurey Morale,  MD  potassium chloride (KLOR-CON) 10 MEQ tablet Take 2 tablets by mouth 2 (two) times daily 10/24/21   Laurey Morale, MD  triamcinolone cream (KENALOG) 0.1 % Apply 1 application topically 2 (two) times daily. 10/18/20   Laurey Morale, MD    Family History Family History  Problem Relation Age of Onset   Colon polyps Mother 63   Colon polyps Sister 9   Colon cancer Neg Hx    Esophageal cancer Neg Hx    Stomach cancer Neg Hx    Rectal cancer Neg Hx     Social History Social History   Tobacco Use   Smoking status: Never   Smokeless tobacco: Never  Vaping Use   Vaping Use: Never used  Substance Use Topics   Alcohol use: No    Alcohol/week: 0.0 standard drinks of alcohol    Comment: special occassions-weddings/anniversaries/birthdays   Drug use: No     Allergies    Lisinopril   Review of Systems Review of Systems  Cardiovascular:  Positive for chest pain.  Musculoskeletal:  Positive for back pain.  Neurological:  Positive for headaches.     Physical Exam Triage Vital Signs ED Triage Vitals  Enc Vitals Group     BP 02/16/22 0930 133/85     Pulse Rate 02/16/22 0930 83     Resp 02/16/22 0930 16     Temp 02/16/22 0930 98.3 F (36.8 C)     Temp Source 02/16/22 0930 Oral     SpO2 02/16/22 0930 96 %     Weight --      Height --      Head Circumference --      Peak Flow --      Pain Score 02/16/22 0929 4     Pain Loc --      Pain Edu? --      Excl. in [place]? --    No data found.  Updated Vital Signs BP 133/85 (BP Location: Right Arm)   Pulse 83   Temp 98.3 F (36.8 C) (Oral)   Resp 16   LMP 05/03/2015   SpO2 96%   Visual Acuity Right Eye Distance:   Left Eye Distance:   Bilateral Distance:    Right Eye Near:   Left Eye Near:    Bilateral Near:     Physical Exam Vitals and nursing note reviewed.  Constitutional:      General: She is not in acute distress.    Appearance: Normal appearance. She is not ill-appearing, toxic-appearing or diaphoretic.  HENT:     Head: Normocephalic and atraumatic.  Eyes:     Pupils: Pupils are equal, round, and reactive to light.  Cardiovascular:     Rate and Rhythm: Normal rate and regular rhythm.     Heart sounds: Normal heart sounds.  Pulmonary:     Effort: Pulmonary effort is normal.     Breath sounds: Normal breath sounds.  Musculoskeletal:       Arms:  Skin:    General: Skin is warm and dry.  Neurological:     General: No focal deficit present.     Mental Status: She is alert and oriented to person, place, and time.     Cranial Nerves: No facial asymmetry.     Motor: No weakness.  Psychiatric:        Mood and Affect: Mood normal.        Behavior: Behavior normal.      UC Treatments /  Results  Labs (all labs ordered are listed, but only abnormal results are  displayed) Labs Reviewed - No data to display  EKG Sinus rhythm heart rate 77 with no acute ST-T wave changes or ectopy.  Compared to EKG from 2017 no acute changes  Radiology No results found.  Procedures Procedures (including critical care time)  Medications Ordered in UC Medications - No data to display  Initial Impression / Assessment and Plan / UC Course  I have reviewed the triage vital signs and the nursing notes.  Pertinent labs & imaging results that were available during my care of the patient were reviewed by me and considered in my medical decision making (see chart for details).     Reviewed exam and symptoms with patient.  I discussed limitations and abilities of urgent care. Given her episode of chest pain this morning with mid back pain, dizziness, and her family history, I advise she go to the emergency room for further evaluation and treatment.  Patient denies any active chest pain at this time.  She is agreeable to go to the emergency room via POV.  I did instruct her to follow-up or call 911 for any worsening symptoms that occur in transit and she verbalized understanding.  Patient was discharged in stable condition and in no acute distress Final Clinical Impressions(s) / UC Diagnoses   Final diagnoses:  Chest pain, unspecified type   Discharge Instructions   None    ED Prescriptions   None    PDMP not reviewed this encounter.   Melynda Ripple, NP 02/16/22 1009

## 2022-02-16 NOTE — ED Triage Notes (Signed)
Pt c/o headache, left sided hip pain, back pain, chest discomfort (shooting pain, started today) and increase belching.  Started: a week ago   Home interventions: advil

## 2022-02-16 NOTE — ED Notes (Signed)
Patient is being discharged from the Urgent Care and sent to the Emergency Department via POV . Per Rosario Adie NP, patient is in need of higher level of care due to need for further evaluation . Patient is aware and verbalizes understanding of plan of care.  Vitals:   02/16/22 0930  BP: 133/85  Pulse: 83  Resp: 16  Temp: 98.3 F (36.8 C)  SpO2: 96%

## 2022-02-16 NOTE — Discharge Instructions (Signed)
Please go to the emergency room for further evaluation of your chest pain, dizziness, mid upper back pain

## 2022-03-07 ENCOUNTER — Other Ambulatory Visit: Payer: Self-pay

## 2022-03-07 ENCOUNTER — Encounter: Payer: Self-pay | Admitting: Family Medicine

## 2022-03-07 MED ORDER — TRIAMCINOLONE ACETONIDE 0.1 % EX CREA: 1.0000 | TOPICAL_CREAM | Freq: Two times a day (BID) | CUTANEOUS | 5 refills | Status: AC

## 2022-08-05 ENCOUNTER — Other Ambulatory Visit: Payer: Self-pay | Admitting: Family Medicine

## 2022-11-01 ENCOUNTER — Encounter: Payer: Self-pay | Admitting: Family Medicine

## 2022-11-01 ENCOUNTER — Ambulatory Visit (INDEPENDENT_AMBULATORY_CARE_PROVIDER_SITE_OTHER): Payer: Commercial Managed Care - PPO | Admitting: Family Medicine

## 2022-11-01 VITALS — BP 118/78 | HR 78 | Temp 98.2°F | Ht 62.15 in | Wt 202.0 lb

## 2022-11-01 DIAGNOSIS — Z0000 Encounter for general adult medical examination without abnormal findings: Secondary | ICD-10-CM

## 2022-11-01 MED ORDER — AMLODIPINE BESYLATE 10 MG PO TABS: 10.0000 mg | ORAL_TABLET | Freq: Every day | ORAL | 3 refills | Status: DC

## 2022-11-01 MED ORDER — POTASSIUM CHLORIDE ER 10 MEQ PO TBCR: EXTENDED_RELEASE_TABLET | ORAL | 3 refills | Status: DC

## 2022-11-01 NOTE — Progress Notes (Signed)
   Subjective:    Patient ID: Kristina Gillespie, female    DOB: [birth year], 57 y.o.   MRN: [id number]  HPI Here for a well exam. She feels great.    Review of Systems  Constitutional: Negative.   HENT: Negative.    Eyes: Negative.   Respiratory: Negative.    Cardiovascular: Negative.   Gastrointestinal: Negative.   Genitourinary:  Negative for decreased urine volume, difficulty urinating, dyspareunia, dysuria, enuresis, flank pain, frequency, hematuria, pelvic pain and urgency.  Musculoskeletal: Negative.   Skin: Negative.   Neurological: Negative.  Negative for headaches.  Psychiatric/Behavioral: Negative.         Objective:   Physical Exam Constitutional:      General: She is not in acute distress.    Appearance: Normal appearance. She is well-developed.  HENT:     Head: Normocephalic and atraumatic.     Right Ear: External ear normal.     Left Ear: External ear normal.     Nose: Nose normal.     Mouth/Throat:     Pharynx: No oropharyngeal exudate.  Eyes:     General: No scleral icterus.    Conjunctiva/sclera: Conjunctivae normal.     Pupils: Pupils are equal, round, and reactive to light.  Neck:     Thyroid: No thyromegaly.     Vascular: No JVD.  Cardiovascular:     Rate and Rhythm: Normal rate and regular rhythm.     Heart sounds: Normal heart sounds. No murmur heard.    No friction rub. No gallop.  Pulmonary:     Effort: Pulmonary effort is normal. No respiratory distress.     Breath sounds: Normal breath sounds. No wheezing or rales.  Chest:     Chest wall: No tenderness.  Abdominal:     General: Bowel sounds are normal. There is no distension.     Palpations: Abdomen is soft. There is no mass.     Tenderness: There is no abdominal tenderness. There is no guarding or rebound.  Musculoskeletal:        General: No tenderness. Normal range of motion.     Cervical back: Normal range of motion and neck supple.  Lymphadenopathy:     Cervical: No cervical  adenopathy.  Skin:    General: Skin is warm and dry.     Findings: No erythema or rash.  Neurological:     General: No focal deficit present.     Mental Status: She is alert and oriented to person, place, and time.     Cranial Nerves: No cranial nerve deficit.     Motor: No abnormal muscle tone.     Coordination: Coordination normal.     Deep Tendon Reflexes: Reflexes are normal and symmetric. Reflexes normal.  Psychiatric:        Mood and Affect: Mood normal.        Behavior: Behavior normal.        Thought Content: Thought content normal.        Judgment: Judgment normal.           Assessment & Plan:  Well exam. We discussed diet and exercise. Get fasting labs. Gershon Crane, MD

## 2022-11-02 ENCOUNTER — Other Ambulatory Visit (INDEPENDENT_AMBULATORY_CARE_PROVIDER_SITE_OTHER): Payer: Commercial Managed Care - PPO

## 2022-11-02 DIAGNOSIS — Z0000 Encounter for general adult medical examination without abnormal findings: Secondary | ICD-10-CM | POA: Diagnosis not present

## 2022-11-02 LAB — BASIC METABOLIC PANEL
BUN: 7 mg/dL (ref 6–23)
CO2: 32 meq/L (ref 19–32)
Calcium: 9.6 mg/dL (ref 8.4–10.5)
Chloride: 102 meq/L (ref 96–112)
Creatinine, Ser: 0.63 mg/dL (ref 0.40–1.20)
GFR: 98.02 mL/min (ref >60.00)
Glucose, Bld: 98 mg/dL (ref 70–99)
Potassium: 3 meq/L — ABNORMAL LOW (ref 3.5–5.1)
Sodium: 145 meq/L (ref 135–145)

## 2022-11-02 LAB — LIPID PANEL
Cholesterol: 221 mg/dL — ABNORMAL HIGH (ref 0–200)
HDL: 57.4 mg/dL (ref >39.00)
LDL Cholesterol: 145 mg/dL — ABNORMAL HIGH (ref 0–99)
NonHDL: 163.35
Total CHOL/HDL Ratio: 4
Triglycerides: 92 mg/dL (ref 0.0–149.0)
VLDL: 18.4 mg/dL (ref 0.0–40.0)

## 2022-11-02 LAB — HEMOGLOBIN A1C: Hgb A1c MFr Bld: 5.3 % (ref 4.6–6.5)

## 2022-11-02 LAB — HEPATIC FUNCTION PANEL
ALT: 12 U/L (ref 0–35)
AST: 11 U/L (ref 0–37)
Albumin: 4.3 g/dL (ref 3.5–5.2)
Alkaline Phosphatase: 80 U/L (ref 39–117)
Bilirubin, Direct: 0.1 mg/dL (ref 0.0–0.3)
Total Bilirubin: 0.7 mg/dL (ref 0.2–1.2)
Total Protein: 7 g/dL (ref 6.0–8.3)

## 2022-11-02 LAB — CBC WITH DIFFERENTIAL/PLATELET
Basophils Absolute: 0 10*3/uL (ref 0.0–0.1)
Basophils Relative: 0.5 % (ref 0.0–3.0)
Eosinophils Absolute: 0.2 10*3/uL (ref 0.0–0.7)
Eosinophils Relative: 2.1 % (ref 0.0–5.0)
HCT: 44.6 % (ref 36.0–46.0)
Hemoglobin: 14.8 g/dL (ref 12.0–15.0)
Lymphocytes Relative: 35 % (ref 12.0–46.0)
Lymphs Abs: 3 10*3/uL (ref 0.7–4.0)
MCHC: 33.2 g/dL (ref 30.0–36.0)
MCV: 87.7 fL (ref 78.0–100.0)
Monocytes Absolute: 0.3 10*3/uL (ref 0.1–1.0)
Monocytes Relative: 3.4 % (ref 3.0–12.0)
Neutro Abs: 5.1 10*3/uL (ref 1.4–7.7)
Neutrophils Relative %: 59 % (ref 43.0–77.0)
Platelets: 230 10*3/uL (ref 150.0–400.0)
RBC: 5.08 Mil/uL (ref 3.87–5.11)
RDW: 14.1 % (ref 11.5–15.5)
WBC: 8.6 10*3/uL (ref 4.0–10.5)

## 2022-11-02 LAB — TSH: TSH: 0.69 u[IU]/mL (ref 0.35–5.50)

## 2022-11-14 ENCOUNTER — Encounter: Payer: Self-pay | Admitting: Family Medicine

## 2022-11-15 NOTE — Telephone Encounter (Signed)
Left a message for pt to call the office regarding this

## 2022-11-21 NOTE — Telephone Encounter (Signed)
Returned pt call this afternoon 2nd attempt no success speaking with pt, left a voicemail to call the office back

## 2022-11-22 NOTE — Telephone Encounter (Signed)
Spoke with pt verbalized understanding 

## 2022-11-22 NOTE — Telephone Encounter (Signed)
Pt called, returning CMA's call. CMA was with a patient. Pt asked that CMA call back at her earliest convenience. 

## 2023-01-02 ENCOUNTER — Ambulatory Visit: Payer: 59 | Admitting: Family Medicine

## 2023-01-02 ENCOUNTER — Encounter: Payer: Self-pay | Admitting: Family Medicine

## 2023-01-02 VITALS — BP 124/80 | HR 74 | Temp 97.5°F | Wt 204.0 lb

## 2023-01-02 DIAGNOSIS — R801 Persistent proteinuria, unspecified: Secondary | ICD-10-CM

## 2023-01-02 DIAGNOSIS — E876 Hypokalemia: Secondary | ICD-10-CM | POA: Diagnosis not present

## 2023-01-02 LAB — POC URINALSYSI DIPSTICK (AUTOMATED)
Bilirubin, UA: NEGATIVE
Blood, UA: NEGATIVE
Glucose, UA: NEGATIVE
Ketones, UA: NEGATIVE
Leukocytes, UA: NEGATIVE
Nitrite, UA: NEGATIVE
Protein, UA: POSITIVE — AB
Spec Grav, UA: <=1.005 — AB (ref 1.010–1.025)
Urobilinogen, UA: 0.2 U/dL
pH, UA: 7.5 (ref 5.0–8.0)

## 2023-01-02 NOTE — Addendum Note (Signed)
Addended by: Carola Rhine on: 01/02/2023 11:46 AM   Modules accepted: Orders

## 2023-01-02 NOTE — Progress Notes (Signed)
   Subjective:    Patient ID: Kristina Gillespie, female    DOB: Feb 28, 1965, 57 y.o.   MRN: [id number]  HPI Here to check her urine for protein. She saw her GYN, Dr. Arelia Sneddon, on 12-31-22, and he checked a UA among other things. This showed 3+ protein, so he asked her to follow up with [place]. She feels fine. She has also had persistent hypokalemia even though she is not on a diuretic. She is not diabetic (her A1c on 11-02-22 was 5.3%), and her HTN has been well controlled. She notes that she was pre-eclamptic during her pregnancy and she had protein in the urine at that time.    Review of Systems  Constitutional: Negative.   Respiratory: Negative.    Cardiovascular: Negative.   Gastrointestinal: Negative.   Genitourinary: Negative.        Objective:   Physical Exam Constitutional:      Appearance: Normal appearance.  Cardiovascular:     Rate and Rhythm: Normal rate and regular rhythm.     Pulses: Normal pulses.     Heart sounds: Normal heart sounds.  Pulmonary:     Effort: Pulmonary effort is normal.     Breath sounds: Normal breath sounds.  Musculoskeletal:     Right lower leg: No edema.     Left lower leg: No edema.  Neurological:     Mental Status: She is alert.           Assessment & Plan:  She has persistent proteinuria along with hypokalemia. We will refer her to Nephrology to investigate this further. Her HTN is well controlled.  Gershon Crane, MD

## 2023-01-10 ENCOUNTER — Telehealth: Payer: Self-pay | Admitting: Family Medicine

## 2023-01-10 DIAGNOSIS — R801 Persistent proteinuria, unspecified: Secondary | ICD-10-CM

## 2023-01-10 NOTE — Telephone Encounter (Signed)
The kidney doctor I have referred her to wants [place] to check a magnesium level before she sees him. I have ordered the lab, so have Safi make a lab appt

## 2023-01-11 ENCOUNTER — Encounter: Payer: Self-pay | Admitting: Family Medicine

## 2023-01-11 ENCOUNTER — Telehealth: Payer: Self-pay

## 2023-01-11 NOTE — Telephone Encounter (Signed)
Copied from CRM [id number]. Topic: Clinical - Medical Advice >> Jan 11, 2023  2:33 PM Theodis Sato wrote: Reason for CRM: PT is requesting a call back from Dr. Claris Che nurse regarding a MyChart message that states there was a new order for magnesium testing, PT is unclear as to why she needs this and if she will need to fast for this blood work. Please advise PT soon so that she can schedule the lab work.

## 2023-01-14 ENCOUNTER — Telehealth: Payer: Self-pay

## 2023-01-14 NOTE — Telephone Encounter (Signed)
Already sent pt a MyChart regarding this

## 2023-01-14 NOTE — Telephone Encounter (Signed)
Copied from CRM [id number]. Topic: Clinical - Request for Lab/Test Order >> Jan 14, 2023  3:26 PM [place] A wrote: Reason for CRM: Patient wants Nurse to call her back regarding orders of if she needs to fast or not for lab

## 2023-01-15 ENCOUNTER — Other Ambulatory Visit (INDEPENDENT_AMBULATORY_CARE_PROVIDER_SITE_OTHER): Payer: 59

## 2023-01-15 DIAGNOSIS — R801 Persistent proteinuria, unspecified: Secondary | ICD-10-CM | POA: Diagnosis not present

## 2023-01-15 LAB — MAGNESIUM: Magnesium: 1.9 mg/dL (ref 1.5–2.5)

## 2023-01-15 NOTE — Telephone Encounter (Signed)
Pt in route to office and call center will let pt know she does not need to fast

## 2023-01-15 NOTE — Telephone Encounter (Signed)
Patient does not need to fast.  Left message on machine for patient to return our call.

## 2023-01-15 NOTE — Telephone Encounter (Signed)
Lab was collected 01/15/2023.

## 2023-02-28 ENCOUNTER — Other Ambulatory Visit: Payer: Self-pay | Admitting: Nephrology

## 2023-02-28 DIAGNOSIS — R809 Proteinuria, unspecified: Secondary | ICD-10-CM

## 2023-03-01 ENCOUNTER — Ambulatory Visit
Admission: RE | Admit: 2023-03-01 | Discharge: 2023-03-01 | Disposition: A | Discharge status: Home / Self Care | Payer: 59 | Source: Ambulatory Visit | Attending: Nephrology

## 2023-03-01 DIAGNOSIS — R809 Proteinuria, unspecified: Secondary | ICD-10-CM

## 2023-03-07 LAB — LAB REPORT - SCANNED
A1c: 5.5
HM HIV Screening: NEGATIVE
HM Hepatitis Screen: NEGATIVE

## 2023-03-27 ENCOUNTER — Ambulatory Visit: Payer: Self-pay | Admitting: Family Medicine

## 2023-03-27 NOTE — Telephone Encounter (Signed)
 Noted.

## 2023-03-27 NOTE — Telephone Encounter (Signed)
 Pt called back in and I took her call.   Got her scheduled for 7:30 AM 03/28/2023.

## 2023-03-27 NOTE — Telephone Encounter (Signed)
 Chief Complaint: Productive cough Symptoms: runny nose, productive cough Frequency: began Sunday Pertinent Negatives: Patient denies fever, SOB, CP Disposition: [] ED /[] Urgent Care (no appt availability in office) / [] Appointment(In office/virtual)/ []  [facility]/ [] Home Care/ [] Refused Recommended Disposition /[] [facility]/ []  Follow-up with PCP Additional Notes: Pt reports she began with productive cough with green sputum on Sunday. Denies fever, CP, SOB.    Copied from CRM [id number]. Topic: Clinical - Red Word Triage >> Mar 27, 2023  3:14 PM Theodis Sato wrote: Red Word that prompted transfer to Nurse Triage: Coughing up green mucus. Reason for Disposition  SEVERE coughing spells (e.g., whooping sound after coughing, vomiting after coughing)  Answer Assessment - Initial Assessment Questions 1. ONSET: "When did the cough begin?"      Began Sunday  3. SPUTUM: "Describe the color of your sputum" (none, dry cough; clear, white, yellow, green)     green 4. HEMOPTYSIS: "Are you coughing up any blood?" If so ask: "How much?" (flecks, streaks, tablespoons, etc.)     This AM when she blew her nose 5. DIFFICULTY BREATHING: "Are you having difficulty breathing?" If Yes, ask: "How bad is it?" (e.g., mild, moderate, severe)    - MILD: No SOB at rest, mild SOB with walking, speaks normally in sentences, can lie down, no retractions, pulse < 100.    - MODERATE: SOB at rest, SOB with minimal exertion and prefers to sit, cannot lie down flat, speaks in phrases, mild retractions, audible wheezing, pulse 100-120.    - SEVERE: Very SOB at rest, speaks in single words, struggling to breathe, sitting hunched forward, retractions, pulse > 120      None 6. FEVER: "Do you have a fever?" If Yes, ask: "What is your temperature, how was it measured, and when did it start?"     None  10. OTHER SYMPTOMS: "Do you have any other symptoms?" (e.g., runny nose, wheezing, chest pain)       Runny  nose  Protocols used: Cough - Acute Productive-A-AH

## 2023-03-28 ENCOUNTER — Ambulatory Visit: Admitting: Adult Health

## 2023-03-28 VITALS — BP 122/80 | HR 75 | Temp 97.8°F | Ht 62.0 in | Wt 201.0 lb

## 2023-03-28 DIAGNOSIS — J069 Acute upper respiratory infection, unspecified: Secondary | ICD-10-CM

## 2023-03-28 NOTE — Progress Notes (Signed)
 Subjective:    Patient ID: CARLISS PORCARO, female    DOB: 02/10/1965, 58 y.o.   MRN: [id number]  HPI 58 year old female who  has a past medical history of Anemia, Anxiety, Eczema, GERD (gastroesophageal reflux disease), Heart murmur, Hyperlipidemia, Hypertension, Routine gynecological examination, and Viral encephalitis (1988).  She presents to the office today for an acute issue.  Reports that about 2 weeks ago she went to a concert and was not feeling well, had bodyaches and chills, few days later she developed a productive cough.  Her symptoms resolved for a few days and then she developed sinus pain and pressure and a productive cough with green mucus.  She has been using over-the-counter cold and cough medication but has not had to take it for the last 24 hours.  She does feel much improved.  She has not had any fevers.  Is no longer having sinus pain or pressure and her cough is happening less frequently.  Review of Systems See HPI   Past Medical History:  Diagnosis Date   Anemia    on meds   Anxiety    hx of/at times   Eczema    GERD (gastroesophageal reflux disease)    OTC meds/with certain foods   Heart murmur    current   Hyperlipidemia    not currently on meds   Hypertension    on meds   Routine gynecological examination    sees Dr. 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    Social History   Socioeconomic History   Marital status: Married    Spouse name: Not on file   Number of children: Not on file   Years of education: Not on file   Highest education level: Not on file  Occupational History   Not on file  Tobacco Use   Smoking status: Never   Smokeless tobacco: Never  Vaping Use   Vaping status: Never Used  Substance and Sexual Activity   Alcohol use: No    Alcohol/week: 0.0 standard drinks of alcohol    Comment: special occassions-weddings/anniversaries/birthdays   Drug use: No   Sexual activity: Not on file  Other Topics Concern    Not on file  Social History Narrative   Not on file   Social Drivers of Health   Financial Resource Strain: Not on file  Food Insecurity: Not on file  Transportation Needs: Not on file  Physical Activity: Not on file  Stress: Not on file  Social Connections: Not on file  Intimate Partner Violence: Not on file    Past Surgical History:  Procedure Laterality Date   COLONOSCOPY  11/30/2019   per Dr. Myrtie Neither, no polyps, repeat in 5 yrs (prior scope had adenomatous polyps)   TONSILLECTOMY  2011   TUBAL LIGATION  1997    Family History  Problem Relation Age of Onset   Colon polyps Mother 91   Colon polyps Sister 33   Colon cancer Neg Hx    Esophageal cancer Neg Hx    Stomach cancer Neg Hx    Rectal cancer Neg Hx     Allergies  Allergen Reactions   Lisinopril     REACTION: cough    Current Outpatient Medications on File Prior to Visit  Medication Sig Dispense Refill   amLODipine (NORVASC) 10 MG tablet Take 1 tablet (10 mg total) by mouth daily. 90 tablet 3   betamethasone valerate ointment (VALISONE) 0.1 % Apply  1 application topically 2 (two) times daily. (Patient taking differently: Apply 1 application  topically 2 (two) times daily as needed.) 45 g 5   Cholecalciferol (VITAMIN D3) 50 MCG (2000 UT) TABS Take 1 tablet by mouth daily.     losartan (COZAAR) 25 MG tablet Take 25 mg by mouth daily.     potassium chloride (KLOR-CON) 10 MEQ tablet Take 2 tablets by mouth 2 (two) times daily 360 tablet 3   triamcinolone cream (KENALOG) 0.1 % Apply 1 Application topically 2 (two) times daily. 45 g 5   No current facility-administered medications on file prior to visit.    BP 122/80   Pulse 75   Temp 97.8 F (36.6 C) (Oral)   Ht 5\' 2"  (1.575 m)   Wt 201 lb (91.2 kg)   LMP 05/03/2015   SpO2 98%   BMI 36.76 kg/m       Objective:   Physical Exam Constitutional:      Appearance: Normal appearance.  HENT:     Nose: Nose normal. No congestion or rhinorrhea.      Mouth/Throat:     Mouth: Mucous membranes are moist.     Pharynx: Oropharynx is clear.  Cardiovascular:     Rate and Rhythm: Normal rate and regular rhythm.     Pulses: Normal pulses.     Heart sounds: Normal heart sounds.  Pulmonary:     Effort: Pulmonary effort is normal.     Breath sounds: Normal breath sounds.  Musculoskeletal:        General: Normal range of motion.  Lymphadenopathy:     Head:     Right side of head: No submental, submandibular, tonsillar, preauricular, posterior auricular or occipital adenopathy.     Left side of head: No submental, submandibular, tonsillar, preauricular, posterior auricular or occipital adenopathy.  Skin:    General: Skin is warm and dry.  Neurological:     General: No focal deficit present.     Mental Status: She is alert and oriented to person, place, and time.  Psychiatric:        Mood and Affect: Mood normal.        Behavior: Behavior normal.        Thought Content: Thought content normal.        Judgment: Judgment normal.       Assessment & Plan:   1. Viral upper respiratory tract infection with cough (Primary) -Reassurance given.  No signs of bacterial infection.  She may have had flu or other viral infection at the beginning, got over that and then caught something else.  Since she is feeling much better today advised conservative measures such as hydration and rest.  She can take over-the-counter medication if needed.  We discussed cough medication but she did not want any at this time.  Follow-up if symptoms come back or she develops a fever.  Shirline Frees, NP

## 2023-11-04 ENCOUNTER — Encounter: Payer: Self-pay | Admitting: Family Medicine

## 2023-11-04 ENCOUNTER — Ambulatory Visit: Admitting: Family Medicine

## 2023-11-04 VITALS — BP 130/80 | HR 80 | Temp 97.7°F | Ht 62.0 in | Wt 211.0 lb

## 2023-11-04 DIAGNOSIS — Z0000 Encounter for general adult medical examination without abnormal findings: Secondary | ICD-10-CM

## 2023-11-04 DIAGNOSIS — Z129 Encounter for screening for malignant neoplasm, site unspecified: Secondary | ICD-10-CM

## 2023-11-04 LAB — URINALYSIS, ROUTINE W REFLEX MICROSCOPIC
Bilirubin Urine: NEGATIVE
Hgb urine dipstick: NEGATIVE
Ketones, ur: NEGATIVE
Leukocytes,Ua: NEGATIVE
Nitrite: NEGATIVE
Specific Gravity, Urine: 1.02 (ref 1.000–1.030)
Total Protein, Urine: 30 — AB
Urine Glucose: NEGATIVE
Urobilinogen, UA: 0.2 (ref 0.0–1.0)
pH: 7.5 (ref 5.0–8.0)

## 2023-11-04 LAB — BASIC METABOLIC PANEL WITH GFR
BUN: 13 mg/dL (ref 6–23)
CO2: 27 meq/L (ref 19–32)
Calcium: 9.5 mg/dL (ref 8.4–10.5)
Chloride: 104 meq/L (ref 96–112)
Creatinine, Ser: 0.68 mg/dL (ref 0.40–1.20)
GFR: 95.56 mL/min (ref >60.00)
Glucose, Bld: 97 mg/dL (ref 70–99)
Potassium: 3.7 meq/L (ref 3.5–5.1)
Sodium: 140 meq/L (ref 135–145)

## 2023-11-04 LAB — CBC WITH DIFFERENTIAL/PLATELET
Basophils Absolute: 0 K/uL (ref 0.0–0.1)
Basophils Relative: 0.5 % (ref 0.0–3.0)
Eosinophils Absolute: 0.1 K/uL (ref 0.0–0.7)
Eosinophils Relative: 1.6 % (ref 0.0–5.0)
HCT: 43.4 % (ref 36.0–46.0)
Hemoglobin: 14.5 g/dL (ref 12.0–15.0)
Lymphocytes Relative: 35.9 % (ref 12.0–46.0)
Lymphs Abs: 3.3 K/uL (ref 0.7–4.0)
MCHC: 33.3 g/dL (ref 30.0–36.0)
MCV: 87.4 fl (ref 78.0–100.0)
Monocytes Absolute: 0.4 K/uL (ref 0.1–1.0)
Monocytes Relative: 4 % (ref 3.0–12.0)
Neutro Abs: 5.3 K/uL (ref 1.4–7.7)
Neutrophils Relative %: 58 % (ref 43.0–77.0)
Platelets: 214 K/uL (ref 150.0–400.0)
RBC: 4.96 Mil/uL (ref 3.87–5.11)
RDW: 13.7 % (ref 11.5–15.5)
WBC: 9.1 K/uL (ref 4.0–10.5)

## 2023-11-04 LAB — HEPATIC FUNCTION PANEL
ALT: 11 U/L (ref 0–35)
AST: 12 U/L (ref 0–37)
Albumin: 4.4 g/dL (ref 3.5–5.2)
Alkaline Phosphatase: 62 U/L (ref 39–117)
Bilirubin, Direct: 0 mg/dL (ref 0.0–0.3)
Total Bilirubin: 0.3 mg/dL (ref 0.2–1.2)
Total Protein: 7.3 g/dL (ref 6.0–8.3)

## 2023-11-04 LAB — LIPID PANEL
Cholesterol: 201 mg/dL — ABNORMAL HIGH (ref 0–200)
HDL: 58.5 mg/dL (ref >39.00)
LDL Cholesterol: 124 mg/dL — ABNORMAL HIGH (ref 0–99)
NonHDL: 142.18
Total CHOL/HDL Ratio: 3
Triglycerides: 91 mg/dL (ref 0.0–149.0)
VLDL: 18.2 mg/dL (ref 0.0–40.0)

## 2023-11-04 LAB — TSH: TSH: 1.04 u[IU]/mL (ref 0.35–5.50)

## 2023-11-04 LAB — HEMOGLOBIN A1C: Hgb A1c MFr Bld: 5.6 % (ref 4.6–6.5)

## 2023-11-04 MED ORDER — AMLODIPINE BESYLATE 10 MG PO TABS: 10.0000 mg | ORAL_TABLET | Freq: Every day | ORAL | 3 refills | Status: AC

## 2023-11-04 MED ORDER — LOSARTAN POTASSIUM 50 MG PO TABS: 50.0000 mg | ORAL_TABLET | Freq: Every day | ORAL | 3 refills | Status: AC

## 2023-11-04 MED ORDER — POTASSIUM CHLORIDE ER 10 MEQ PO TBCR: EXTENDED_RELEASE_TABLET | ORAL | 3 refills | Status: AC

## 2023-11-04 NOTE — Progress Notes (Signed)
 Subjective:    Patient ID: Kristina Gillespie, female    DOB: 1965-08-19, 58 y.o.   MRN: [id number]  HPI Here for a well exam. She feels well but she asks about help with her weight. She has gained some weight in the past year, and she si trying to watch her diet. She does not get much exercise. Her BP has been stable. She sees Dr. Dolan for proteinuria, and she had a normal renal US  earlier this year. She asks about using a GLP-1 medication. She also asks about screening for pancreatic cancer. Both her mother and her mother's mother have been diagnosed with this.    Review of Systems  Constitutional: Negative.   HENT: Negative.    Eyes: Negative.   Respiratory: Negative.    Cardiovascular: Negative.   Gastrointestinal: Negative.   Genitourinary:  Negative for decreased urine volume, difficulty urinating, dyspareunia, dysuria, enuresis, flank pain, frequency, hematuria, pelvic pain and urgency.  Musculoskeletal: Negative.   Skin: Negative.   Neurological: Negative.  Negative for headaches.  Psychiatric/Behavioral: Negative.         Objective:   Physical Exam Constitutional:      General: She is not in acute distress.    Appearance: She is well-developed. She is obese.  HENT:     Head: Normocephalic and atraumatic.     Right Ear: External ear normal.     Left Ear: External ear normal.     Nose: Nose normal.     Mouth/Throat:     Pharynx: No oropharyngeal exudate.  Eyes:     General: No scleral icterus.    Conjunctiva/sclera: Conjunctivae normal.     Pupils: Pupils are equal, round, and reactive to light.  Neck:     Thyroid : No thyromegaly.     Vascular: No JVD.  Cardiovascular:     Rate and Rhythm: Normal rate and regular rhythm.     Pulses: Normal pulses.     Heart sounds: Normal heart sounds. No murmur heard.    No friction rub. No gallop.  Pulmonary:     Effort: Pulmonary effort is normal. No respiratory distress.     Breath sounds: Normal breath sounds. No  wheezing or rales.  Chest:     Chest wall: No tenderness.  Abdominal:     General: Bowel sounds are normal. There is no distension.     Palpations: Abdomen is soft. There is no mass.     Tenderness: There is no abdominal tenderness. There is no guarding or rebound.  Musculoskeletal:        General: No tenderness. Normal range of motion.     Cervical back: Normal range of motion and neck supple.  Lymphadenopathy:     Cervical: No cervical adenopathy.  Skin:    General: Skin is warm and dry.     Findings: No erythema or rash.  Neurological:     General: No focal deficit present.     Mental Status: She is alert and oriented to person, place, and time.     Cranial Nerves: No cranial nerve deficit.     Motor: No abnormal muscle tone.     Coordination: Coordination normal.     Deep Tendon Reflexes: Reflexes are normal and symmetric. Reflexes normal.  Psychiatric:        Mood and Affect: Mood normal.        Behavior: Behavior normal.        Thought Content: Thought content normal.  Judgment: Judgment normal.           Assessment & Plan:  Well exam. We discussed diet and exercise. Get fasting labs. To screen for cancer we will check a CA 9-19 and a CEA. She will check with her insurance company about coverage for any GLP-1 medications.  Garnette Olmsted, MD

## 2023-11-06 ENCOUNTER — Ambulatory Visit: Payer: Self-pay | Admitting: Family Medicine

## 2023-11-06 LAB — CEA: CEA: <2 ng/mL

## 2023-11-06 LAB — CANCER ANTIGEN 19-9: CA 19-9: 13 U/mL (ref <34)

## 2024-01-04 ENCOUNTER — Emergency Department (HOSPITAL_COMMUNITY)

## 2024-01-04 ENCOUNTER — Other Ambulatory Visit: Payer: Self-pay

## 2024-01-04 ENCOUNTER — Emergency Department (HOSPITAL_COMMUNITY)
Admission: EM | Admit: 2024-01-04 | Discharge: 2024-01-05 | Discharge status: Against Medical Advice | Attending: Emergency Medicine | Admitting: Emergency Medicine

## 2024-01-04 ENCOUNTER — Encounter (HOSPITAL_COMMUNITY): Payer: Self-pay | Admitting: *Deleted

## 2024-01-04 DIAGNOSIS — Z5321 Procedure and treatment not carried out due to patient leaving prior to being seen by health care provider: Secondary | ICD-10-CM | POA: Insufficient documentation

## 2024-01-04 DIAGNOSIS — M25561 Pain in right knee: Secondary | ICD-10-CM | POA: Diagnosis not present

## 2024-01-04 DIAGNOSIS — W01198A Fall on same level from slipping, tripping and stumbling with subsequent striking against other object, initial encounter: Secondary | ICD-10-CM | POA: Diagnosis not present

## 2024-01-04 DIAGNOSIS — R110 Nausea: Secondary | ICD-10-CM | POA: Diagnosis present

## 2024-01-04 DIAGNOSIS — S0083XA Contusion of other part of head, initial encounter: Secondary | ICD-10-CM | POA: Diagnosis not present

## 2024-01-04 DIAGNOSIS — R519 Headache, unspecified: Secondary | ICD-10-CM | POA: Diagnosis not present

## 2024-01-04 DIAGNOSIS — S99921A Unspecified injury of right foot, initial encounter: Secondary | ICD-10-CM | POA: Diagnosis present

## 2024-01-04 DIAGNOSIS — M25562 Pain in left knee: Secondary | ICD-10-CM | POA: Diagnosis not present

## 2024-01-04 DIAGNOSIS — S92424A Nondisplaced fracture of distal phalanx of right great toe, initial encounter for closed fracture: Secondary | ICD-10-CM | POA: Diagnosis not present

## 2024-01-04 MED ORDER — ONDANSETRON 4 MG PO TBDP
4.0000 mg | ORAL_TABLET | Freq: Once | ORAL | Status: AC
  Administered 2024-01-04: 4 mg via ORAL
  Filled 2024-01-04: qty 1

## 2024-01-04 MED ORDER — IBUPROFEN 400 MG PO TABS
400.0000 mg | ORAL_TABLET | Freq: Once | ORAL | Status: AC
  Administered 2024-01-04: 400 mg via ORAL
  Filled 2024-01-04: qty 1

## 2024-01-04 NOTE — ED Provider Triage Note (Signed)
 Emergency Medicine Provider Triage Evaluation Note  Kristina Gillespie , a 58 y.o. female  was evaluated in triage.  Pt complains of pain and injury after fall.  Patient fell while getting out of the car today.  This was a mechanical fall as her foot caught the strap and she fell forward onto concrete.  She fell primarily on her right side.  Complaining of pain to right great toe, bilateral knees, right ribs, and right side of face.  During exam patient notes numbness to her eyelid and she is unable to feel when she is blinking.  Denies loss of consciousness or blood thinner use.  She does state that she was transiently confused after the fall.  She reports nausea at this time.  Denies any vomiting, change in vision, dizziness.  Patient was able to bear weight and ambulate with discomfort after fall.  Patient is afraid to take any pain medication that may make her feel sleepy.  Review of Systems  Positive: Fall with injury Negative: LOC, vomiting, change in vision, dizziness  Physical Exam  BP (!) 157/83   Pulse 97   Temp 98.7 F (37.1 C) (Oral)   Resp 20   Ht 5' 2 (1.575 m)   Wt 95.7 kg   LMP 05/03/2015   SpO2 97%   BMI 38.59 kg/m  Gen:   Awake, no acute distress. Resp:  Normal effort  MSK:   Moves extremities without difficulty.  Pain and swelling of the right great toe, with small amount of dried blood under tip of toenail.  Patient is unable to bend this toe.  Abrasions to both knees.  Swelling to the left knee without obvious deformity.  Pain with palpation of right side ribs anterior and posterior. Other:  No pain with cardinal eye test and able to follow fully without issue.  Swelling and bruising around the right eye with tenderness.  Medical Decision Making  Medically screening exam initiated at 7:17 PM.  Appropriate orders placed.  LATANZA PFEFFERKORN was informed that the remainder of the evaluation will be completed by another provider, this initial triage assessment does not  replace that evaluation, and the importance of remaining in the ED until their evaluation is complete.   Rosina Almarie LABOR, PA-C 01/04/24 1925

## 2024-01-04 NOTE — ED Triage Notes (Signed)
 Ice pack to her face

## 2024-01-04 NOTE — ED Notes (Addendum)
 No longer could wait. Decided to leave.

## 2024-01-04 NOTE — ED Triage Notes (Signed)
 Pt c/o nausea and facial pain following a trip and fall this evening.  Pain score 7/10.  Pt noted to have an abrasion on R cheek.  Denies blood thinners and LOC.  Pt reports hitting her face on concrete.

## 2024-01-05 ENCOUNTER — Emergency Department (HOSPITAL_BASED_OUTPATIENT_CLINIC_OR_DEPARTMENT_OTHER)
Admission: EM | Admit: 2024-01-05 | Discharge: 2024-01-05 | Disposition: A | Discharge status: Home / Self Care | Attending: Emergency Medicine | Admitting: Emergency Medicine

## 2024-01-05 ENCOUNTER — Encounter (HOSPITAL_BASED_OUTPATIENT_CLINIC_OR_DEPARTMENT_OTHER): Payer: Self-pay

## 2024-01-05 DIAGNOSIS — M25562 Pain in left knee: Secondary | ICD-10-CM | POA: Insufficient documentation

## 2024-01-05 DIAGNOSIS — S0083XA Contusion of other part of head, initial encounter: Secondary | ICD-10-CM | POA: Insufficient documentation

## 2024-01-05 DIAGNOSIS — M25561 Pain in right knee: Secondary | ICD-10-CM | POA: Insufficient documentation

## 2024-01-05 DIAGNOSIS — S92424A Nondisplaced fracture of distal phalanx of right great toe, initial encounter for closed fracture: Secondary | ICD-10-CM | POA: Insufficient documentation

## 2024-01-05 DIAGNOSIS — W01198A Fall on same level from slipping, tripping and stumbling with subsequent striking against other object, initial encounter: Secondary | ICD-10-CM | POA: Insufficient documentation

## 2024-01-05 NOTE — ED Triage Notes (Signed)
 PT self ambulated to exam 14 with steady gait. Pt states she fell last night and hit her head was seen at [facility] but left prior to results being given. PT a/o x 4 presents with swelling and contusion to right side of face/jaw. Pt denies any increased symptoms VSS NAD PT on room air.

## 2024-01-05 NOTE — ED Provider Notes (Signed)
 [place] [facility] AT [facility] Provider Note   CSN: [id number] Arrival date & time: 01/05/24  9443     Patient presents with: [place]   Kristina Gillespie is a 58 y.o. female.   Patient caught her leg caught on her purse strap while trying to get out of the car and she fell to the pavement.  Patient with bruising, abrasion to the right side of her face, bilateral knee pain, right foot pain.  Patient was at [facility] earlier tonight.  She got imaging but the wait was so long she left before getting results.  Patient also indicates that after she fell there was some fluid that came out of her left ear.  No bleeding.       Prior to Admission medications  Medication Sig Start Date End Date Taking? Authorizing Provider  amLODipine  (NORVASC ) 10 MG tablet Take 1 tablet (10 mg total) by mouth daily. 11/04/23   Johnny Garnette LABOR, MD  betamethasone  valerate ointment (VALISONE ) 0.1 % Apply 1 application topically 2 (two) times daily. Patient taking differently: Apply 1 application  topically 2 (two) times daily as needed. 10/06/19   Johnny Garnette LABOR, MD  Cholecalciferol (VITAMIN D3) 50 MCG (2000 UT) TABS Take 1 tablet by mouth daily. 03/12/23   [provider]  losartan  (COZAAR ) 50 MG tablet Take 1 tablet (50 mg total) by mouth daily. 11/04/23   Johnny Garnette LABOR, MD  potassium chloride  (KLOR-CON ) 10 MEQ tablet Take 2 tablets by mouth 2 (two) times daily 11/04/23   Johnny Garnette LABOR, MD  triamcinolone  cream (KENALOG ) 0.1 % Apply 1 Application topically 2 (two) times daily. 03/07/22   Johnny Garnette LABOR, MD    Allergies: Lisinopril    Review of Systems  Updated Vital Signs BP 132/81 (BP Location: Right Arm)   Pulse 80   Resp 18   Ht 5' 2 (1.575 m)   Wt 92.1 kg   LMP 05/03/2015   SpO2 98%   BMI 37.13 kg/m   Physical Exam Vitals and nursing note reviewed.  Constitutional:      General: She is not in acute distress.    Appearance: She is well-developed.  HENT:      Head: Normocephalic. Contusion present.      Left Ear: Tympanic membrane normal.     Ears:     Comments: Large amount of liquid wax in the left ear canal, visualized portion of tympanic membrane is normal.  No bleeding.    Mouth/Throat:     Mouth: Mucous membranes are moist.  Eyes:     General: Vision grossly intact. Gaze aligned appropriately.     Extraocular Movements: Extraocular movements intact.     Conjunctiva/sclera: Conjunctivae normal.  Cardiovascular:     Rate and Rhythm: Normal rate and regular rhythm.     Pulses: Normal pulses.     Heart sounds: Normal heart sounds, S1 normal and S2 normal. No murmur heard.    No friction rub. No gallop.  Pulmonary:     Effort: Pulmonary effort is normal. No respiratory distress.     Breath sounds: Normal breath sounds.  Abdominal:     General: Bowel sounds are normal.     Palpations: Abdomen is soft.     Tenderness: There is no abdominal tenderness. There is no guarding or rebound.     Hernia: No hernia is present.  Musculoskeletal:        General: No swelling.     Cervical back: Full  passive range of motion without pain, normal range of motion and neck supple. No spinous process tenderness or muscular tenderness. Normal range of motion.     Right knee: Normal range of motion. Tenderness present.     Left knee: Normal range of motion. Tenderness present.     Right lower leg: No edema.     Left lower leg: No edema.     Right foot: Tenderness present. No swelling or deformity.  Skin:    General: Skin is warm and dry.     Capillary Refill: Capillary refill takes less than 2 seconds.     Findings: No ecchymosis, erythema, rash or wound.  Neurological:     General: No focal deficit present.     Mental Status: She is alert and oriented to person, place, and time.     GCS: GCS eye subscore is 4. GCS verbal subscore is 5. GCS motor subscore is 6.     Cranial Nerves: Cranial nerves 2-12 are intact.     Sensory: Sensation is intact.      Motor: Motor function is intact.     Coordination: Coordination is intact.  Psychiatric:        Attention and Perception: Attention normal.        Mood and Affect: Mood normal.        Speech: Speech normal.        Behavior: Behavior normal.     (all labs ordered are listed, but only abnormal results are displayed) Labs Reviewed - No data to display  EKG: None  Radiology: DG Knee Complete 4 Views Right Result Date: 01/04/2024 EXAM: 4 OR MORE VIEW(S) XRAY OF THE KNEE 01/04/2024 08:36:17 PM COMPARISON: None available. CLINICAL HISTORY: pain after fall FINDINGS: BONES AND JOINTS: No acute fracture. No malalignment. No significant joint effusion. SOFT TISSUES: The soft tissues are unremarkable. IMPRESSION: 1. No acute fracture or dislocation. Electronically signed by: Oneil Devonshire MD 01/04/2024 08:46 PM EST RP Workstation: [id number]   DG Knee Complete 4 Views Left Result Date: 01/04/2024 EXAM: 4 VIEW(S) XRAY OF THE LEFT KNEE 01/04/2024 08:36:17 PM COMPARISON: None available. CLINICAL HISTORY: pain after fall FINDINGS: BONES AND JOINTS: No acute fracture. No malalignment. No significant joint effusion. SOFT TISSUES: The soft tissues are unremarkable. IMPRESSION: 1. No acute fracture or dislocation. Electronically signed by: Oneil Devonshire MD 01/04/2024 08:45 PM EST RP Workstation: [id number]   DG Chest 2 View Result Date: 01/04/2024 EXAM: 2 VIEW(S) XRAY OF THE CHEST 01/04/2024 08:36:17 PM COMPARISON: None available. CLINICAL HISTORY: pain after fall FINDINGS: LUNGS AND PLEURA: No focal pulmonary opacity. No pleural effusion. No pneumothorax. HEART AND MEDIASTINUM: No acute abnormality of the cardiac and mediastinal silhouettes. BONES AND SOFT TISSUES: No acute osseous abnormality. IMPRESSION: 1. No acute cardiopulmonary abnormality identified. Electronically signed by: Oneil Devonshire MD 01/04/2024 08:45 PM EST RP Workstation: [id number]   DG Foot Complete Right Result Date: 01/04/2024 EXAM: 3 OR  MORE VIEW(S) XRAY OF THE FOOT 01/04/2024 08:36:17 PM COMPARISON: None available. CLINICAL HISTORY: pain after fall FINDINGS: BONES AND JOINTS: Acute nondisplaced intra-articular fracture of the lateral base of the first distal phalanx. No malalignment. SOFT TISSUES: Soft tissue swelling of the midfoot. IMPRESSION: 1. Acute nondisplaced intra-articular fracture of the lateral base of the first distal phalanx. 2. Soft tissue swelling of the midfoot. Electronically signed by: Oneil Devonshire MD 01/04/2024 08:44 PM EST RP Workstation: [id number]   CT Head Wo Contrast Result Date: 01/04/2024 EXAM: CT HEAD WITHOUT 01/04/2024 08:11:54 PM TECHNIQUE: CT of  the head was performed without the administration of intravenous contrast. Automated exposure control, iterative reconstruction, and/or weight based adjustment of the mA/kV was utilized to reduce the radiation dose to as low as reasonably achievable. COMPARISON: None available. CLINICAL HISTORY: Head trauma, moderate-severe FINDINGS: BRAIN AND VENTRICLES: No acute intracranial hemorrhage. No mass effect or midline shift. No extra-axial fluid collection. No evidence of acute infarct. Partially empty sella. ORBITS: Right facial contusion. SINUSES AND MASTOIDS: No acute abnormality. SOFT TISSUES AND SKULL: No acute skull fracture. No acute soft tissue abnormality. IMPRESSION: 1. No acute intracranial abnormality. 2. Right facial contusion. 3. Partially empty sella, likely incidental without acute significance. Electronically signed by: Oneil Devonshire MD 01/04/2024 08:19 PM EST RP Workstation: [id number]   CT Maxillofacial WO CM Result Date: 01/04/2024 EXAM: CT OF THE FACE WITHOUT CONTRAST 01/04/2024 08:11:54 PM TECHNIQUE: CT of the face was performed without the administration of intravenous contrast. Multiplanar reformatted images are provided for review. Automated exposure control, iterative reconstruction, and/or weight based adjustment of the mA/kV was utilized to reduce  the radiation dose to as low as reasonably achievable. COMPARISON: None available. CLINICAL HISTORY: Recent fall with right-sided facial swelling. FINDINGS: FACIAL BONES: No acute facial fracture. No mandibular dislocation. No suspicious bone lesion. ORBITS: Globes are intact. No acute traumatic injury. No inflammatory change. SINUSES AND MASTOIDS: No acute abnormality. SOFT TISSUES: Surrounding soft tissue structures show mild edema over the right cheek. No sizable hematoma is noted. IMPRESSION: 1. No acute facial fracture. 2. Mild right cheek soft tissue edema without sizable hematoma. Electronically signed by: Oneil Devonshire MD 01/04/2024 08:17 PM EST RP Workstation: [id number]     Procedures   Medications Ordered in the ED - No data to display                                  Medical Decision Making  Imaging from prior visit to [facility] reviewed.  Patient does have a fracture of the proximal phalanx of her right great toe, otherwise all imaging was normal.  Patient reassured, CAM Walker, follow-up with orthopedics.     Final diagnoses:  Closed nondisplaced fracture of distal phalanx of right great toe, initial encounter    ED Discharge Orders     None          Haze Lonni PARAS, MD 01/05/24 [phone]

## 2024-01-08 ENCOUNTER — Ambulatory Visit: Admitting: Family Medicine

## 2024-01-08 ENCOUNTER — Encounter: Payer: Self-pay | Admitting: Family Medicine

## 2024-01-08 VITALS — BP 124/80 | HR 80 | Temp 98.5°F | Wt 211.0 lb

## 2024-01-08 DIAGNOSIS — S0083XD Contusion of other part of head, subsequent encounter: Secondary | ICD-10-CM | POA: Diagnosis not present

## 2024-01-08 DIAGNOSIS — S92424D Nondisplaced fracture of distal phalanx of right great toe, subsequent encounter for fracture with routine healing: Secondary | ICD-10-CM | POA: Diagnosis not present

## 2024-01-08 DIAGNOSIS — S8000XD Contusion of unspecified knee, subsequent encounter: Secondary | ICD-10-CM

## 2024-01-08 NOTE — Progress Notes (Signed)
° °  Subjective:    Patient ID: Kristina Gillespie, female    DOB: [birth year], 58 y.o.   MRN: [id number]  HPI Here to follow up on an ED visit on 01-05-23 when she presented with injuries to her face, knees and right foot. She got her foot caught while getting out of her car, causing her to fal to the pavement. She landed on her right face and both knees. No LOC. At the ED she had CT scans of her head and and the maxillofacial area, and these were negative. Xrays of her knees were negative. Xrays of the right foot revealed a non-displaced fracture to the proximal end of the right great toe distal phalanx. She was given a boot to wear, but she admits to never using it. She has been back to work and her normal activities. She still has some pain with walking, but this is getting better.    Review of Systems  Constitutional: Negative.   Respiratory: Negative.    Cardiovascular: Negative.   Musculoskeletal:  Positive for arthralgias.       Objective:   Physical Exam Constitutional:      Appearance: Normal appearance.  Cardiovascular:     Rate and Rhythm: Normal rate and regular rhythm.     Pulses: Normal pulses.     Heart sounds: Normal heart sounds.  Pulmonary:     Effort: Pulmonary effort is normal.     Breath sounds: Normal breath sounds.  Musculoskeletal:     Comments: The right great toe is mildly swollen and ecchymotic. She is tender over the IP joint. ROM of the entire toe is normal. Alignment is normal.   Neurological:     Mental Status: She is alert.           Assessment & Plan:  Right toe fracture. This is healing as expected. She can ice it several times a day. Recheck only as needed. I personally spent a total of 32 minutes in the care of the patient today including getting/reviewing separately obtained history, performing a medically appropriate exam/evaluation, independently interpreting results, and communicating results .  Garnette Olmsted, MD  Garnette Olmsted, MD
